# Patient Record
Sex: Male | Born: 1945 | Race: White | Hispanic: No | Marital: Married | Smoking: Current every day smoker
Health system: Southern US, Community
[De-identification: ages and names within clinical notes are randomized; demographics above are authoritative.]

## PROBLEM LIST (undated history)

## (undated) DIAGNOSIS — M1990 Unspecified osteoarthritis, unspecified site: Secondary | ICD-10-CM

## (undated) DIAGNOSIS — R030 Elevated blood-pressure reading, without diagnosis of hypertension: Secondary | ICD-10-CM

## (undated) DIAGNOSIS — A6920 Lyme disease, unspecified: Secondary | ICD-10-CM

## (undated) DIAGNOSIS — Z973 Presence of spectacles and contact lenses: Secondary | ICD-10-CM

## (undated) DIAGNOSIS — H44811 Hemophthalmos, right eye: Secondary | ICD-10-CM

## (undated) DIAGNOSIS — C61 Malignant neoplasm of prostate: Secondary | ICD-10-CM

## (undated) DIAGNOSIS — H3530 Unspecified macular degeneration: Secondary | ICD-10-CM

## (undated) DIAGNOSIS — H348392 Tributary (branch) retinal vein occlusion, unspecified eye, stable: Secondary | ICD-10-CM

## (undated) HISTORY — PX: TONSILLECTOMY: SUR1361

## (undated) HISTORY — DX: Hemophthalmos, right eye: H44.811

## (undated) HISTORY — DX: Malignant neoplasm of prostate: C61

## (undated) HISTORY — PX: PROSTATE BIOPSY: SHX241

---

## 1983-07-30 HISTORY — PX: INGUINAL HERNIA REPAIR: SUR1180

## 1983-07-30 HISTORY — PX: HERNIA REPAIR: SHX51

## 2015-09-15 DIAGNOSIS — R5382 Chronic fatigue, unspecified: Secondary | ICD-10-CM | POA: Diagnosis not present

## 2015-09-15 DIAGNOSIS — R69 Illness, unspecified: Secondary | ICD-10-CM | POA: Diagnosis not present

## 2015-09-15 DIAGNOSIS — I10 Essential (primary) hypertension: Secondary | ICD-10-CM | POA: Diagnosis not present

## 2015-09-15 DIAGNOSIS — Z0000 Encounter for general adult medical examination without abnormal findings: Secondary | ICD-10-CM | POA: Diagnosis not present

## 2015-09-15 DIAGNOSIS — E875 Hyperkalemia: Secondary | ICD-10-CM | POA: Diagnosis not present

## 2015-09-15 DIAGNOSIS — D492 Neoplasm of unspecified behavior of bone, soft tissue, and skin: Secondary | ICD-10-CM | POA: Diagnosis not present

## 2015-09-19 DIAGNOSIS — R030 Elevated blood-pressure reading, without diagnosis of hypertension: Secondary | ICD-10-CM | POA: Diagnosis not present

## 2015-09-19 DIAGNOSIS — Z0000 Encounter for general adult medical examination without abnormal findings: Secondary | ICD-10-CM | POA: Diagnosis not present

## 2015-09-19 DIAGNOSIS — D492 Neoplasm of unspecified behavior of bone, soft tissue, and skin: Secondary | ICD-10-CM | POA: Diagnosis not present

## 2015-09-19 DIAGNOSIS — H35362 Drusen (degenerative) of macula, left eye: Secondary | ICD-10-CM | POA: Diagnosis not present

## 2015-09-19 DIAGNOSIS — Z23 Encounter for immunization: Secondary | ICD-10-CM | POA: Diagnosis not present

## 2015-09-19 DIAGNOSIS — R69 Illness, unspecified: Secondary | ICD-10-CM | POA: Diagnosis not present

## 2015-09-27 DIAGNOSIS — D485 Neoplasm of uncertain behavior of skin: Secondary | ICD-10-CM | POA: Diagnosis not present

## 2015-09-27 DIAGNOSIS — D225 Melanocytic nevi of trunk: Secondary | ICD-10-CM | POA: Diagnosis not present

## 2015-09-27 DIAGNOSIS — D234 Other benign neoplasm of skin of scalp and neck: Secondary | ICD-10-CM | POA: Diagnosis not present

## 2015-11-14 DIAGNOSIS — R030 Elevated blood-pressure reading, without diagnosis of hypertension: Secondary | ICD-10-CM | POA: Diagnosis not present

## 2015-11-14 DIAGNOSIS — D492 Neoplasm of unspecified behavior of bone, soft tissue, and skin: Secondary | ICD-10-CM | POA: Diagnosis not present

## 2015-11-14 DIAGNOSIS — R69 Illness, unspecified: Secondary | ICD-10-CM | POA: Diagnosis not present

## 2015-11-14 DIAGNOSIS — H35362 Drusen (degenerative) of macula, left eye: Secondary | ICD-10-CM | POA: Diagnosis not present

## 2016-01-17 DIAGNOSIS — H353223 Exudative age-related macular degeneration, left eye, with inactive scar: Secondary | ICD-10-CM | POA: Diagnosis not present

## 2016-01-17 DIAGNOSIS — H353112 Nonexudative age-related macular degeneration, right eye, intermediate dry stage: Secondary | ICD-10-CM | POA: Diagnosis not present

## 2016-07-18 DIAGNOSIS — H353112 Nonexudative age-related macular degeneration, right eye, intermediate dry stage: Secondary | ICD-10-CM | POA: Diagnosis not present

## 2016-07-18 DIAGNOSIS — H353223 Exudative age-related macular degeneration, left eye, with inactive scar: Secondary | ICD-10-CM | POA: Diagnosis not present

## 2016-07-29 DIAGNOSIS — Z8619 Personal history of other infectious and parasitic diseases: Secondary | ICD-10-CM

## 2016-07-29 HISTORY — DX: Personal history of other infectious and parasitic diseases: Z86.19

## 2016-09-18 DIAGNOSIS — R69 Illness, unspecified: Secondary | ICD-10-CM | POA: Diagnosis not present

## 2016-09-18 DIAGNOSIS — R030 Elevated blood-pressure reading, without diagnosis of hypertension: Secondary | ICD-10-CM | POA: Diagnosis not present

## 2016-09-18 DIAGNOSIS — H35362 Drusen (degenerative) of macula, left eye: Secondary | ICD-10-CM | POA: Diagnosis not present

## 2016-09-18 DIAGNOSIS — D492 Neoplasm of unspecified behavior of bone, soft tissue, and skin: Secondary | ICD-10-CM | POA: Diagnosis not present

## 2016-09-22 DIAGNOSIS — F17200 Nicotine dependence, unspecified, uncomplicated: Secondary | ICD-10-CM | POA: Insufficient documentation

## 2016-09-22 DIAGNOSIS — H35369 Drusen (degenerative) of macula, unspecified eye: Secondary | ICD-10-CM | POA: Insufficient documentation

## 2016-09-22 DIAGNOSIS — L259 Unspecified contact dermatitis, unspecified cause: Secondary | ICD-10-CM | POA: Insufficient documentation

## 2016-09-22 DIAGNOSIS — D492 Neoplasm of unspecified behavior of bone, soft tissue, and skin: Secondary | ICD-10-CM | POA: Insufficient documentation

## 2016-09-23 DIAGNOSIS — Z1212 Encounter for screening for malignant neoplasm of rectum: Secondary | ICD-10-CM | POA: Diagnosis not present

## 2016-09-23 DIAGNOSIS — L309 Dermatitis, unspecified: Secondary | ICD-10-CM | POA: Diagnosis not present

## 2016-09-23 DIAGNOSIS — Z0000 Encounter for general adult medical examination without abnormal findings: Secondary | ICD-10-CM | POA: Diagnosis not present

## 2016-09-23 DIAGNOSIS — Z6824 Body mass index (BMI) 24.0-24.9, adult: Secondary | ICD-10-CM | POA: Diagnosis not present

## 2016-09-23 DIAGNOSIS — D492 Neoplasm of unspecified behavior of bone, soft tissue, and skin: Secondary | ICD-10-CM | POA: Diagnosis not present

## 2016-09-23 DIAGNOSIS — R69 Illness, unspecified: Secondary | ICD-10-CM | POA: Diagnosis not present

## 2016-09-23 DIAGNOSIS — H35362 Drusen (degenerative) of macula, left eye: Secondary | ICD-10-CM | POA: Diagnosis not present

## 2016-09-23 DIAGNOSIS — R030 Elevated blood-pressure reading, without diagnosis of hypertension: Secondary | ICD-10-CM | POA: Diagnosis not present

## 2016-09-29 DIAGNOSIS — IMO0002 Reserved for concepts with insufficient information to code with codable children: Secondary | ICD-10-CM | POA: Insufficient documentation

## 2016-09-29 DIAGNOSIS — R030 Elevated blood-pressure reading, without diagnosis of hypertension: Secondary | ICD-10-CM | POA: Insufficient documentation

## 2017-01-06 DIAGNOSIS — H353223 Exudative age-related macular degeneration, left eye, with inactive scar: Secondary | ICD-10-CM | POA: Diagnosis not present

## 2017-01-06 DIAGNOSIS — H353112 Nonexudative age-related macular degeneration, right eye, intermediate dry stage: Secondary | ICD-10-CM | POA: Diagnosis not present

## 2017-01-15 DIAGNOSIS — Z6825 Body mass index (BMI) 25.0-25.9, adult: Secondary | ICD-10-CM | POA: Diagnosis not present

## 2017-01-15 DIAGNOSIS — R69 Illness, unspecified: Secondary | ICD-10-CM | POA: Diagnosis not present

## 2017-01-15 DIAGNOSIS — H3530 Unspecified macular degeneration: Secondary | ICD-10-CM | POA: Diagnosis not present

## 2017-01-15 DIAGNOSIS — R030 Elevated blood-pressure reading, without diagnosis of hypertension: Secondary | ICD-10-CM | POA: Diagnosis not present

## 2017-01-15 DIAGNOSIS — Z0000 Encounter for general adult medical examination without abnormal findings: Secondary | ICD-10-CM | POA: Diagnosis not present

## 2017-07-08 DIAGNOSIS — H353223 Exudative age-related macular degeneration, left eye, with inactive scar: Secondary | ICD-10-CM | POA: Diagnosis not present

## 2017-07-08 DIAGNOSIS — H35031 Hypertensive retinopathy, right eye: Secondary | ICD-10-CM | POA: Diagnosis not present

## 2017-07-08 DIAGNOSIS — H353112 Nonexudative age-related macular degeneration, right eye, intermediate dry stage: Secondary | ICD-10-CM | POA: Diagnosis not present

## 2017-08-05 DIAGNOSIS — H35031 Hypertensive retinopathy, right eye: Secondary | ICD-10-CM | POA: Diagnosis not present

## 2017-09-08 DIAGNOSIS — H34831 Tributary (branch) retinal vein occlusion, right eye, with macular edema: Secondary | ICD-10-CM | POA: Diagnosis not present

## 2017-09-08 DIAGNOSIS — H353223 Exudative age-related macular degeneration, left eye, with inactive scar: Secondary | ICD-10-CM | POA: Diagnosis not present

## 2017-09-22 ENCOUNTER — Encounter: Payer: Self-pay | Admitting: Gastroenterology

## 2017-09-22 DIAGNOSIS — E782 Mixed hyperlipidemia: Secondary | ICD-10-CM | POA: Diagnosis not present

## 2017-09-22 DIAGNOSIS — L309 Dermatitis, unspecified: Secondary | ICD-10-CM | POA: Diagnosis not present

## 2017-09-22 DIAGNOSIS — G9332 Myalgic encephalomyelitis/chronic fatigue syndrome: Secondary | ICD-10-CM | POA: Insufficient documentation

## 2017-09-22 DIAGNOSIS — R5382 Chronic fatigue, unspecified: Secondary | ICD-10-CM | POA: Diagnosis not present

## 2017-09-22 DIAGNOSIS — R69 Illness, unspecified: Secondary | ICD-10-CM | POA: Diagnosis not present

## 2017-09-22 DIAGNOSIS — D492 Neoplasm of unspecified behavior of bone, soft tissue, and skin: Secondary | ICD-10-CM | POA: Diagnosis not present

## 2017-09-22 DIAGNOSIS — R030 Elevated blood-pressure reading, without diagnosis of hypertension: Secondary | ICD-10-CM | POA: Diagnosis not present

## 2017-09-22 DIAGNOSIS — H35362 Drusen (degenerative) of macula, left eye: Secondary | ICD-10-CM | POA: Diagnosis not present

## 2017-09-25 DIAGNOSIS — R69 Illness, unspecified: Secondary | ICD-10-CM | POA: Diagnosis not present

## 2017-09-25 DIAGNOSIS — Z0000 Encounter for general adult medical examination without abnormal findings: Secondary | ICD-10-CM | POA: Diagnosis not present

## 2017-09-25 DIAGNOSIS — H35362 Drusen (degenerative) of macula, left eye: Secondary | ICD-10-CM | POA: Diagnosis not present

## 2017-09-25 DIAGNOSIS — Z6824 Body mass index (BMI) 24.0-24.9, adult: Secondary | ICD-10-CM | POA: Diagnosis not present

## 2017-10-13 DIAGNOSIS — H34831 Tributary (branch) retinal vein occlusion, right eye, with macular edema: Secondary | ICD-10-CM | POA: Diagnosis not present

## 2017-10-14 ENCOUNTER — Encounter: Payer: Self-pay | Admitting: Gastroenterology

## 2017-10-14 DIAGNOSIS — Z1211 Encounter for screening for malignant neoplasm of colon: Secondary | ICD-10-CM | POA: Diagnosis not present

## 2017-10-14 DIAGNOSIS — Z1212 Encounter for screening for malignant neoplasm of rectum: Secondary | ICD-10-CM | POA: Diagnosis not present

## 2017-10-14 LAB — COLOGUARD

## 2017-10-21 DIAGNOSIS — R195 Other fecal abnormalities: Secondary | ICD-10-CM | POA: Insufficient documentation

## 2017-10-28 ENCOUNTER — Encounter: Payer: Self-pay | Admitting: Gastroenterology

## 2017-12-15 DIAGNOSIS — H353223 Exudative age-related macular degeneration, left eye, with inactive scar: Secondary | ICD-10-CM | POA: Diagnosis not present

## 2017-12-15 DIAGNOSIS — H34831 Tributary (branch) retinal vein occlusion, right eye, with macular edema: Secondary | ICD-10-CM | POA: Diagnosis not present

## 2018-01-01 ENCOUNTER — Other Ambulatory Visit: Payer: Self-pay | Admitting: *Deleted

## 2018-01-01 ENCOUNTER — Encounter: Payer: Self-pay | Admitting: *Deleted

## 2018-01-01 ENCOUNTER — Encounter: Payer: Self-pay | Admitting: Gastroenterology

## 2018-01-01 ENCOUNTER — Ambulatory Visit (INDEPENDENT_AMBULATORY_CARE_PROVIDER_SITE_OTHER): Payer: Medicare HMO | Admitting: Gastroenterology

## 2018-01-01 DIAGNOSIS — R195 Other fecal abnormalities: Secondary | ICD-10-CM | POA: Diagnosis not present

## 2018-01-01 MED ORDER — NA SULFATE-K SULFATE-MG SULF 17.5-3.13-1.6 GM/177ML PO SOLN: 1.0000 | ORAL | 0 refills | Status: DC

## 2018-01-01 NOTE — Progress Notes (Signed)
Primary Care Physician:  Allwardt, Alyssa, PA  Primary Gastroenterologist:  Barney Drain, MD   Chief Complaint  Patient presents with  . +Cologuard    stool sometimes "runny", doesn't have complete bm    HPI:  Dillon Walton is a 72 y.o. male here at the request of Alyssa Allwardt, PA for further evaluation of positive Cologuard test.  Patient has never had a colonoscopy.  Recently agreed to Cologuard testing, initially declining colonoscopy.  Came back positive.  Generally has a bowel movement every morning but notes that he has to go back a couple times in a row to complete his bowel movement.  Stools are soft.  No blood in the stool or melena.  No abdominal pain.  Appetite is good.  No upper GI symptoms.  No unintentional weight loss.  No family history of colon cancer.  Patient states he has a history of hemorrhoids but really not bothered by them.  Current Outpatient Medications  Medication Sig Dispense Refill  . Multiple Vitamin (MULTIVITAMIN) tablet Take 1 tablet by mouth daily.    . Multiple Vitamins-Minerals (VISION FORMULA EYE HEALTH PO) Take by mouth 2 (two) times daily.    . vitamin C (ASCORBIC ACID) 500 MG tablet Take 1,000 mg by mouth daily.     No current facility-administered medications for this visit.     Allergies as of 01/01/2018  . (No Known Allergies)    Past Medical History:  Diagnosis Date  . Eye hemorrhage, right     History reviewed. No pertinent surgical history.  Family History  Problem Relation Age of Onset  . Other Mother        died age 69  . Other Father        mva  . Colon cancer Neg Hx     Social History   Socioeconomic History  . Marital status: Married    Spouse name: Not on file  . Number of children: Not on file  . Years of education: Not on file  . Highest education level: Not on file  Occupational History  . Not on file  Social Needs  . Financial resource strain: Not on file  . Food insecurity:    Worry: Not on file   Inability: Not on file  . Transportation needs:    Medical: Not on file    Non-medical: Not on file  Tobacco Use  . Smoking status: Current Every Day Smoker    Types: Cigarettes  . Smokeless tobacco: Never Used  Substance and Sexual Activity  . Alcohol use: Yes    Comment: 12-15 cans of beer/week  . Drug use: Not on file  . Sexual activity: Never  Lifestyle  . Physical activity:    Days per week: Not on file    Minutes per session: Not on file  . Stress: Not on file  Relationships  . Social connections:    Talks on phone: Not on file    Gets together: Not on file    Attends religious service: Not on file    Active member of club or organization: Not on file    Attends meetings of clubs or organizations: Not on file    Relationship status: Not on file  . Intimate partner violence:    Fear of current or ex partner: Not on file    Emotionally abused: Not on file    Physically abused: Not on file    Forced sexual activity: Not on file  Other Topics Concern  .  Not on file  Social History Narrative  . Not on file      ROS:  General: Negative for anorexia, weight loss, fever, chills, fatigue, weakness. Eyes: Negative for vision changes.  ENT: Negative for hoarseness, difficulty swallowing , nasal congestion. CV: Negative for chest pain, angina, palpitations, dyspnea on exertion, peripheral edema.  Respiratory: Negative for dyspnea at rest, dyspnea on exertion, cough, sputum, wheezing.  GI: See history of present illness. GU:  Negative for dysuria, hematuria, urinary incontinence, urinary frequency, nocturnal urination.  MS: Negative for joint pain, low back pain.  Derm: Negative for rash or itching.  Neuro: Negative for weakness, abnormal sensation, seizure, frequent headaches, memory loss, confusion.  Psych: Negative for anxiety, depression, suicidal ideation, hallucinations.  Endo: Negative for unusual weight change.  Heme: Negative for bruising or bleeding. Allergy:  Negative for rash or hives.    Physical Examination:  BP (!) 147/96   Pulse 97   Temp 97.9 F (36.6 C) (Oral)   Ht 5\' 10"  (1.778 m)   Wt 178 lb (80.7 kg)   BMI 25.54 kg/m    General: Well-nourished, well-developed in no acute distress.  Head: Normocephalic, atraumatic.   Eyes: Conjunctiva pink, no icterus. Mouth: Oropharyngeal mucosa moist and pink , no lesions erythema or exudate. Neck: Supple without thyromegaly, masses, or lymphadenopathy.  Lungs: Clear to auscultation bilaterally.  Heart: Regular rate and rhythm, no murmurs rubs or gallops.  Abdomen: Bowel sounds are normal, nontender, nondistended, no hepatosplenomegaly or masses, no abdominal bruits or    hernia , no rebound or guarding.   Rectal: Deferred Extremities: No lower extremity edema. No clubbing or deformities.  Neuro: Alert and oriented x 4 , grossly normal neurologically.  Skin: Warm and dry, no rash or jaundice.   Psych: Alert and cooperative, normal mood and affect.  Labs: Labs dated 09/22/2017 TSH 1.270, white blood cell count 8900, hemoglobin 15.8, hematocrit 46.4, MCV 93, platelets 3 and 34,000, glucose 86, BUN 14, creatinine 0.99, sodium 145, potassium 4.9, calcium 9.5, total bilirubin 0.6, alkaline phosphatase 93, AST 20, ALT 16, albumin 4.5.  Imaging Studies: No results found.

## 2018-01-01 NOTE — Assessment & Plan Note (Addendum)
72 year old gentleman with no prior colonoscopy presenting for positive Cologuard test. Minor change in bowel function.  Recommend colonoscopy with deep sedation (given frequent alcohol use).  I have discussed the risks, alternatives, benefits with regards to but not limited to the risk of reaction to medication, bleeding, infection, perforation and the patient is agreeable to proceed. Written consent to be obtained.

## 2018-01-01 NOTE — Patient Instructions (Signed)
1. Colonoscopy as scheduled. See separate instructions.  

## 2018-01-02 ENCOUNTER — Telehealth: Payer: Self-pay | Admitting: *Deleted

## 2018-01-02 ENCOUNTER — Telehealth: Payer: Self-pay | Admitting: Gastroenterology

## 2018-01-02 MED ORDER — PEG 3350-KCL-NA BICARB-NACL 420 G PO SOLR: 4000.0000 mL | Freq: Once | ORAL | 0 refills | Status: AC

## 2018-01-02 NOTE — Telephone Encounter (Signed)
Spoke with pt spouse. She is aware will send in trylite for pt. Will mail new instructions to them (confirmed mailing address).

## 2018-01-02 NOTE — Progress Notes (Signed)
cc'd to pcp 

## 2018-01-02 NOTE — Telephone Encounter (Signed)
PATIENT WIFE CALLED AND STATED THAT THE SUPREP IS 100$ AND HE WILL NOT TAKE IT.  HE WILL NEED SOMETHING ELSE CALLED IN.  PLEASE CALL HIS WIFE ABOUT THIS.

## 2018-01-02 NOTE — Telephone Encounter (Signed)
Pre-op scheduled for 03/17/18 at 11:00am. Letter mailed. LMOVM.

## 2018-01-26 DIAGNOSIS — A6920 Lyme disease, unspecified: Secondary | ICD-10-CM

## 2018-01-26 HISTORY — DX: Lyme disease, unspecified: A69.20

## 2018-02-14 DIAGNOSIS — R6883 Chills (without fever): Secondary | ICD-10-CM | POA: Diagnosis not present

## 2018-02-14 DIAGNOSIS — R339 Retention of urine, unspecified: Secondary | ICD-10-CM | POA: Diagnosis not present

## 2018-02-14 DIAGNOSIS — Z6824 Body mass index (BMI) 24.0-24.9, adult: Secondary | ICD-10-CM | POA: Diagnosis not present

## 2018-02-16 DIAGNOSIS — E860 Dehydration: Secondary | ICD-10-CM | POA: Diagnosis not present

## 2018-02-16 DIAGNOSIS — E876 Hypokalemia: Secondary | ICD-10-CM | POA: Diagnosis not present

## 2018-02-17 DIAGNOSIS — R5383 Other fatigue: Secondary | ICD-10-CM | POA: Diagnosis not present

## 2018-02-17 DIAGNOSIS — D696 Thrombocytopenia, unspecified: Secondary | ICD-10-CM | POA: Diagnosis not present

## 2018-02-17 DIAGNOSIS — Z6824 Body mass index (BMI) 24.0-24.9, adult: Secondary | ICD-10-CM | POA: Diagnosis not present

## 2018-02-17 DIAGNOSIS — R6883 Chills (without fever): Secondary | ICD-10-CM | POA: Diagnosis not present

## 2018-03-13 NOTE — Patient Instructions (Signed)
[facility]  03/13/2018     @PREFPERIOPPHARMACY @   Your procedure is scheduled on  03/24/2018   Report to [facility] at  615   A.M.  Call this number if you have problems the morning of surgery:  [phone]   Remember:  Do not eat or drink after midnight.  You may drink clear liquids until ( follow the instructions given to you) .  Clear liquids allowed are:                    Water, Juice (non-citric and without pulp), Carbonated beverages, Clear Tea, Black Coffee only, Plain Jell-O only, Gatorade and Plain Popsicles only    Take these medicines the morning of surgery with A SIP OF WATER None    Do not wear jewelry, make-up or nail polish.  Do not wear lotions, powders, or perfumes, or deodorant.  Do not shave 48 hours prior to surgery.  Men may shave face and neck.  Do not bring valuables to the hospital.  [facility] is not responsible for any belongings or valuables.  Contacts, dentures or bridgework may not be worn into surgery.  Leave your suitcase in the car.  After surgery it may be brought to your room.  For patients admitted to the hospital, discharge time will be determined by your treatment team.  Patients discharged the day of surgery will not be allowed to drive home.   Name and phone number of your driver:   family Special instructions:  Follow the diet and prep instructions given to you by Dr Nona Dell office.  Please read over the following fact sheets that you were given. Anesthesia Post-op Instructions and Care and Recovery After Surgery       Colonoscopy, Adult A colonoscopy is an exam to look at the large intestine. It is done to check for problems, such as:  Lumps (tumors).  Growths (polyps).  Swelling (inflammation).  Bleeding.  What happens before the procedure? Eating and drinking Follow instructions from your doctor about eating and drinking. These instructions may include:  A few days before the procedure - follow  a low-fiber diet. ? Avoid nuts. ? Avoid seeds. ? Avoid dried fruit. ? Avoid raw fruits. ? Avoid vegetables.  1-3 days before the procedure - follow a clear liquid diet. Avoid liquids that have red or purple dye. Drink only clear liquids, such as: ? Clear broth or bouillon. ? Black coffee or tea. ? Clear juice. ? Clear soft drinks or sports drinks. ? Gelatin dessert. ? Popsicles.  On the day of the procedure - do not eat or drink anything during the 2 hours before the procedure.  Bowel prep If you were prescribed an oral bowel prep:  Take it as told by your doctor. Starting the day before your procedure, you will need to drink a lot of liquid. The liquid will cause you to poop (have bowel movements) until your poop is almost clear or light green.  If your skin or butt gets irritated from diarrhea, you may: ? Wipe the area with wipes that have medicine in them, such as adult wet wipes with aloe and vitamin E. ? Put something on your skin that soothes the area, such as petroleum jelly.  If you throw up (vomit) while drinking the bowel prep, take a break for up to 60 minutes. Then begin the bowel prep again. If you keep throwing up and you cannot  take the bowel prep without throwing up, call your doctor.  General instructions  Ask your doctor about changing or stopping your normal medicines. This is important if you take diabetes medicines or blood thinners.  Plan to have someone take you home from the hospital or clinic. What happens during the procedure?  An IV tube may be put into one of your veins.  You will be given medicine to help you relax (sedative).  To reduce your risk of infection: ? Your doctors will wash their hands. ? Your anal area will be washed with soap.  You will be asked to lie on your side with your knees bent.  Your doctor will get a long, thin, flexible tube ready. The tube will have a camera and a light on the end.  The tube will be put into your  anus.  The tube will be gently put into your large intestine.  Air will be delivered into your large intestine to keep it open. You may feel some pressure or cramping.  The camera will be used to take photos.  A small tissue sample may be removed from your body to be looked at under a microscope (biopsy). If any possible problems are found, the tissue will be sent to a lab for testing.  If small growths are found, your doctor may remove them and have them checked for cancer.  The tube that was put into your anus will be slowly removed. The procedure may vary among doctors and hospitals. What happens after the procedure?  Your doctor will check on you often until the medicines you were given have worn off.  Do not drive for 24 hours after the procedure.  You may have a small amount of blood in your poop.  You may pass gas.  You may have mild cramps or bloating in your belly (abdomen).  It is up to you to get the results of your procedure. Ask your doctor, or the department performing the procedure, when your results will be ready. This information is not intended to replace advice given to you by your health care provider. Make sure you discuss any questions you have with your health care provider. Document Released: 08/17/2010 Document Revised: 05/15/2016 Document Reviewed: 09/26/2015 Elsevier Interactive Patient Education  2017 [employer]  Colonoscopy, Adult, Care After This sheet gives you information about how to care for yourself after your procedure. Your health care provider may also give you more specific instructions. If you have problems or questions, contact your health care provider. What can I expect after the procedure? After the procedure, it is common to have:  A small amount of blood in your stool for 24 hours after the procedure.  Some gas.  Mild abdominal cramping or bloating.  Follow these instructions at home: General instructions   For the first  24 hours after the procedure: ? Do not drive or use machinery. ? Do not sign important documents. ? Do not drink alcohol. ? Do your regular daily activities at a slower pace than normal. ? Eat soft, easy-to-digest foods. ? Rest often.  Take over-the-counter or prescription medicines only as told by your health care provider.  It is up to you to get the results of your procedure. Ask your health care provider, or the department performing the procedure, when your results will be ready. Relieving cramping and bloating  Try walking around when you have cramps or feel bloated.  Apply heat to your abdomen as told by your  health care provider. Use a heat source that your health care provider recommends, such as a moist heat pack or a heating pad. ? Place a towel between your skin and the heat source. ? Leave the heat on for 20-30 minutes. ? Remove the heat if your skin turns bright red. This is especially important if you are unable to feel pain, heat, or cold. You may have a greater risk of getting burned. Eating and drinking  Drink enough fluid to keep your urine clear or pale yellow.  Resume your normal diet as instructed by your health care provider. Avoid heavy or fried foods that are hard to digest.  Avoid drinking alcohol for as long as instructed by your health care provider. Contact a health care provider if:  You have blood in your stool 2-3 days after the procedure. Get help right away if:  You have more than a small spotting of blood in your stool.  You pass large blood clots in your stool.  Your abdomen is swollen.  You have nausea or vomiting.  You have a fever.  You have increasing abdominal pain that is not relieved with medicine. This information is not intended to replace advice given to you by your health care provider. Make sure you discuss any questions you have with your health care provider. Document Released: 02/27/2004 Document Revised: 04/08/2016  Document Reviewed: 09/26/2015 Elsevier Interactive Patient Education  2018 [place] Anesthesia is a term that refers to techniques, procedures, and medicines that help a person stay safe and comfortable during a medical procedure. Monitored anesthesia care, or sedation, is one type of anesthesia. Your anesthesia specialist may recommend sedation if you will be having a procedure that does not require you to be unconscious, such as:  Cataract surgery.  A dental procedure.  A biopsy.  A colonoscopy.  During the procedure, you may receive a medicine to help you relax (sedative). There are three levels of sedation:  Mild sedation. At this level, you may feel awake and relaxed. You will be able to follow directions.  Moderate sedation. At this level, you will be sleepy. You may not remember the procedure.  Deep sedation. At this level, you will be asleep. You will not remember the procedure.  The more medicine you are given, the deeper your level of sedation will be. Depending on how you respond to the procedure, the anesthesia specialist may change your level of sedation or the type of anesthesia to fit your needs. An anesthesia specialist will monitor you closely during the procedure. Let your health care provider know about:  Any allergies you have.  All medicines you are taking, including vitamins, herbs, eye drops, creams, and over-the-counter medicines.  Any use of steroids (by mouth or as a cream).  Any problems you or family members have had with sedatives and anesthetic medicines.  Any blood disorders you have.  Any surgeries you have had.  Any medical conditions you have, such as sleep apnea.  Whether you are pregnant or may be pregnant.  Any use of cigarettes, alcohol, or street drugs. What are the risks? Generally, this is a safe procedure. However, problems may occur, including:  Getting too much medicine  (oversedation).  Nausea.  Allergic reaction to medicines.  Trouble breathing. If this happens, a breathing tube may be used to help with breathing. It will be removed when you are awake and breathing on your own.  Heart trouble.  Lung trouble.  Before the  procedure Staying hydrated Follow instructions from your health care provider about hydration, which may include:  Up to 2 hours before the procedure - you may continue to drink clear liquids, such as water, clear fruit juice, black coffee, and plain tea.  Eating and drinking restrictions Follow instructions from your health care provider about eating and drinking, which may include:  8 hours before the procedure - stop eating heavy meals or foods such as meat, fried foods, or fatty foods.  6 hours before the procedure - stop eating light meals or foods, such as toast or cereal.  6 hours before the procedure - stop drinking milk or drinks that contain milk.  2 hours before the procedure - stop drinking clear liquids.  Medicines Ask your health care provider about:  Changing or stopping your regular medicines. This is especially important if you are taking diabetes medicines or blood thinners.  Taking medicines such as aspirin and ibuprofen. These medicines can thin your blood. Do not take these medicines before your procedure if your health care provider instructs you not to.  Tests and exams  You will have a physical exam.  You may have blood tests done to show: ? How well your kidneys and liver are working. ? How well your blood can clot.  General instructions  Plan to have someone take you home from the hospital or clinic.  If you will be going home right after the procedure, plan to have someone with you for 24 hours.  What happens during the procedure?  Your blood pressure, heart rate, breathing, level of pain and overall condition will be monitored.  An IV tube will be inserted into one of your  veins.  Your anesthesia specialist will give you medicines as needed to keep you comfortable during the procedure. This may mean changing the level of sedation.  The procedure will be performed. After the procedure  Your blood pressure, heart rate, breathing rate, and blood oxygen level will be monitored until the medicines you were given have worn off.  Do not drive for 24 hours if you received a sedative.  You may: ? Feel sleepy, clumsy, or nauseous. ? Feel forgetful about what happened after the procedure. ? Have a sore throat if you had a breathing tube during the procedure. ? Vomit. This information is not intended to replace advice given to you by your health care provider. Make sure you discuss any questions you have with your health care provider. Document Released: 04/10/2005 Document Revised: 12/22/2015 Document Reviewed: 11/05/2015 Elsevier Interactive Patient Education  2018 [place], Care After These instructions provide you with information about caring for yourself after your procedure. Your health care provider may also give you more specific instructions. Your treatment has been planned according to current medical practices, but problems sometimes occur. Call your health care provider if you have any problems or questions after your procedure. What can I expect after the procedure? After your procedure, it is common to:  Feel sleepy for several hours.  Feel clumsy and have poor balance for several hours.  Feel forgetful about what happened after the procedure.  Have poor judgment for several hours.  Feel nauseous or vomit.  Have a sore throat if you had a breathing tube during the procedure.  Follow these instructions at home: For at least 24 hours after the procedure:   Do not: ? Participate in activities in which you could fall or become injured. ? Drive. ? Use heavy  machinery. ? Drink alcohol. ? Take sleeping pills or  medicines that cause drowsiness. ? Make important decisions or sign legal documents. ? Take care of children on your own.  Rest. Eating and drinking  Follow the diet that is recommended by your health care provider.  If you vomit, drink water, juice, or soup when you can drink without vomiting.  Make sure you have little or no nausea before eating solid foods. General instructions  Have a responsible adult stay with you until you are awake and alert.  Take over-the-counter and prescription medicines only as told by your health care provider.  If you smoke, do not smoke without supervision.  Keep all follow-up visits as told by your health care provider. This is important. Contact a health care provider if:  You keep feeling nauseous or you keep vomiting.  You feel light-headed.  You develop a rash.  You have a fever. Get help right away if:  You have trouble breathing. This information is not intended to replace advice given to you by your health care provider. Make sure you discuss any questions you have with your health care provider. Document Released: 11/05/2015 Document Revised: 03/06/2016 Document Reviewed: 11/05/2015 Elsevier Interactive Patient Education  [employer].

## 2018-03-17 ENCOUNTER — Other Ambulatory Visit: Payer: Self-pay

## 2018-03-17 ENCOUNTER — Encounter (HOSPITAL_COMMUNITY)
Admission: RE | Admit: 2018-03-17 | Discharge: 2018-03-17 | Disposition: A | Discharge status: Home / Self Care | Payer: Medicare HMO | Source: Ambulatory Visit | Attending: Gastroenterology | Admitting: Gastroenterology

## 2018-03-17 ENCOUNTER — Encounter (HOSPITAL_COMMUNITY): Payer: Self-pay

## 2018-03-17 DIAGNOSIS — Z01810 Encounter for preprocedural cardiovascular examination: Secondary | ICD-10-CM | POA: Diagnosis not present

## 2018-03-17 DIAGNOSIS — Z01812 Encounter for preprocedural laboratory examination: Secondary | ICD-10-CM | POA: Insufficient documentation

## 2018-03-17 HISTORY — DX: Lyme disease, unspecified: A69.20

## 2018-03-17 LAB — CBC
HCT: 44 % (ref 39.0–52.0)
Hemoglobin: 15 g/dL (ref 13.0–17.0)
MCH: 32.3 pg (ref 26.0–34.0)
MCHC: 34.1 g/dL (ref 30.0–36.0)
MCV: 94.6 fL (ref 78.0–100.0)
Platelets: 288 10*3/uL (ref 150–400)
RBC: 4.65 MIL/uL (ref 4.22–5.81)
RDW: 14.1 % (ref 11.5–15.5)
WBC: 10.5 10*3/uL (ref 4.0–10.5)

## 2018-03-17 LAB — BASIC METABOLIC PANEL
Anion gap: 10 (ref 5–15)
BUN: 10 mg/dL (ref 8–23)
CO2: 25 mmol/L (ref 22–32)
Calcium: 9 mg/dL (ref 8.9–10.3)
Chloride: 104 mmol/L (ref 98–111)
Creatinine, Ser: 0.89 mg/dL (ref 0.61–1.24)
GFR calc Af Amer: >60 mL/min (ref >60)
GFR calc non Af Amer: >60 mL/min (ref >60)
Glucose, Bld: 73 mg/dL (ref 70–99)
Potassium: 3.8 mmol/L (ref 3.5–5.1)
Sodium: 139 mmol/L (ref 135–145)

## 2018-03-18 DIAGNOSIS — R5382 Chronic fatigue, unspecified: Secondary | ICD-10-CM | POA: Diagnosis not present

## 2018-03-18 DIAGNOSIS — D696 Thrombocytopenia, unspecified: Secondary | ICD-10-CM | POA: Diagnosis not present

## 2018-03-24 ENCOUNTER — Other Ambulatory Visit: Payer: Self-pay

## 2018-03-24 ENCOUNTER — Ambulatory Visit (HOSPITAL_COMMUNITY): Payer: Medicare HMO | Admitting: Anesthesiology

## 2018-03-24 ENCOUNTER — Encounter (HOSPITAL_COMMUNITY): Payer: Self-pay | Admitting: *Deleted

## 2018-03-24 ENCOUNTER — Ambulatory Visit (HOSPITAL_COMMUNITY)
Admission: RE | Admit: 2018-03-24 | Discharge: 2018-03-24 | Disposition: A | Discharge status: Home / Self Care | Payer: Medicare HMO | Source: Ambulatory Visit | Attending: Gastroenterology | Admitting: Gastroenterology

## 2018-03-24 ENCOUNTER — Encounter (HOSPITAL_COMMUNITY)
Admission: RE | Disposition: A | Discharge status: Home / Self Care | Payer: Self-pay | Source: Ambulatory Visit | Attending: Gastroenterology

## 2018-03-24 DIAGNOSIS — D124 Benign neoplasm of descending colon: Secondary | ICD-10-CM

## 2018-03-24 DIAGNOSIS — F17210 Nicotine dependence, cigarettes, uncomplicated: Secondary | ICD-10-CM | POA: Insufficient documentation

## 2018-03-24 DIAGNOSIS — D122 Benign neoplasm of ascending colon: Secondary | ICD-10-CM | POA: Diagnosis not present

## 2018-03-24 DIAGNOSIS — K648 Other hemorrhoids: Secondary | ICD-10-CM | POA: Insufficient documentation

## 2018-03-24 DIAGNOSIS — D123 Benign neoplasm of transverse colon: Secondary | ICD-10-CM | POA: Diagnosis not present

## 2018-03-24 DIAGNOSIS — K5730 Diverticulosis of large intestine without perforation or abscess without bleeding: Secondary | ICD-10-CM | POA: Diagnosis not present

## 2018-03-24 DIAGNOSIS — D127 Benign neoplasm of rectosigmoid junction: Secondary | ICD-10-CM

## 2018-03-24 DIAGNOSIS — D128 Benign neoplasm of rectum: Secondary | ICD-10-CM | POA: Diagnosis not present

## 2018-03-24 DIAGNOSIS — K621 Rectal polyp: Secondary | ICD-10-CM

## 2018-03-24 DIAGNOSIS — R69 Illness, unspecified: Secondary | ICD-10-CM | POA: Diagnosis not present

## 2018-03-24 DIAGNOSIS — K635 Polyp of colon: Secondary | ICD-10-CM

## 2018-03-24 DIAGNOSIS — Z79899 Other long term (current) drug therapy: Secondary | ICD-10-CM | POA: Insufficient documentation

## 2018-03-24 DIAGNOSIS — R195 Other fecal abnormalities: Secondary | ICD-10-CM

## 2018-03-24 DIAGNOSIS — K644 Residual hemorrhoidal skin tags: Secondary | ICD-10-CM | POA: Insufficient documentation

## 2018-03-24 HISTORY — PX: POLYPECTOMY: SHX5525

## 2018-03-24 HISTORY — PX: COLONOSCOPY WITH PROPOFOL: SHX5780

## 2018-03-24 HISTORY — DX: Unspecified osteoarthritis, unspecified site: M19.90

## 2018-03-24 SURGERY — COLONOSCOPY WITH PROPOFOL
Anesthesia: General

## 2018-03-24 MED ORDER — LACTATED RINGERS IV SOLN
INTRAVENOUS | Status: DC
  Administered 2018-03-24: 07:00:00 via INTRAVENOUS

## 2018-03-24 MED ORDER — FENTANYL CITRATE (PF) 100 MCG/2ML IJ SOLN: 25.0000 ug | INTRAMUSCULAR | Status: DC | PRN

## 2018-03-24 MED ORDER — PROPOFOL 10 MG/ML IV BOLUS
INTRAVENOUS | Status: DC | PRN
  Administered 2018-03-24 (×6): 20 mg via INTRAVENOUS
  Administered 2018-03-24: 10 mg via INTRAVENOUS
  Administered 2018-03-24: 20 mg via INTRAVENOUS

## 2018-03-24 MED ORDER — PROPOFOL 500 MG/50ML IV EMUL
INTRAVENOUS | Status: DC | PRN
  Administered 2018-03-24: 08:00:00 via INTRAVENOUS
  Administered 2018-03-24: 150 ug/kg/min via INTRAVENOUS
  Administered 2018-03-24: 08:00:00 via INTRAVENOUS

## 2018-03-24 MED ORDER — SPOT INK MARKER SYRINGE KIT
PACK | SUBMUCOSAL | Status: AC
  Filled 2018-03-24: qty 5

## 2018-03-24 MED ORDER — PROPOFOL 10 MG/ML IV BOLUS
INTRAVENOUS | Status: AC
  Filled 2018-03-24: qty 20

## 2018-03-24 MED ORDER — PROPOFOL 10 MG/ML IV BOLUS
INTRAVENOUS | Status: AC
  Filled 2018-03-24: qty 40

## 2018-03-24 MED ORDER — SODIUM CHLORIDE 0.9 % IJ SOLN
INTRAMUSCULAR | Status: DC | PRN
  Administered 2018-03-24: 3 mL

## 2018-03-24 MED ORDER — SPOT INK MARKER SYRINGE KIT
PACK | SUBMUCOSAL | Status: DC | PRN
  Administered 2018-03-24: 1 mL via SUBMUCOSAL

## 2018-03-24 MED ORDER — HYDROCODONE-ACETAMINOPHEN 7.5-325 MG PO TABS: 1.0000 | ORAL_TABLET | Freq: Once | ORAL | Status: DC | PRN

## 2018-03-24 NOTE — Discharge Instructions (Signed)
You have small internal hemorrhoids and diverticulosis IN YOUR LEFT AND RIGHT COLON. YOU HAD EIGHT POLYPS REMOVED. ONE WAS LARGER AND I TATTOOED THE BASE.   IF YOU NEED AN MRI, LET THE RADIOLOGY TECH KNOW THAT YOU HAD TWO CLIPS PLACED IN YOUR RECTUM. THEY SHOULD FALL OFF IN 30 DAYS.   DRINK WATER TO KEEP YOUR URINE LIGHT YELLOW.  FOLLOW A HIGH FIBER DIET. AVOID ITEMS THAT CAUSE BLOATING. See info below.   YOUR BIOPSY RESULTS WILL BE AVAILABLE IN 7 DAYS   USE PREPARATION H FOUR TIMES  A DAY IF NEEDED TO RELIEVE RECTAL PAIN/PRESSURE/BLEEDING.  Next colonoscopy in 1-3 years. YOUR SISTERS, BROTHERS, CHILDREN, AND PARENTS NEED TO HAVE A COLONOSCOPY STARTING AT THE AGE OF 40.    Colonoscopy Care After Read the instructions outlined below and refer to this sheet in the next week. These discharge instructions provide you with general information on caring for yourself after you leave the hospital. While your treatment has been planned according to the most current medical practices available, unavoidable complications occasionally occur. If you have any problems or questions after discharge, call DR. Quinesha Selinger, [phone].  ACTIVITY  You may resume your regular activity, but move at a slower pace for the next 24 hours.   Take frequent rest periods for the next 24 hours.   Walking will help get rid of the air and reduce the bloated feeling in your belly (abdomen).   No driving for 24 hours (because of the medicine (anesthesia) used during the test).   You may shower.   Do not sign any important legal documents or operate any machinery for 24 hours (because of the anesthesia used during the test).    NUTRITION  Drink plenty of fluids.   You may resume your normal diet as instructed by your doctor.   Begin with a light meal and progress to your normal diet. Heavy or fried foods are harder to digest and may make you feel sick to your stomach (nauseated).   Avoid alcoholic beverages  for 24 hours or as instructed.    MEDICATIONS  You may resume your normal medications.   WHAT YOU CAN EXPECT TODAY  Some feelings of bloating in the abdomen.   Passage of more gas than usual.   Spotting of blood in your stool or on the toilet paper  .  IF YOU HAD POLYPS REMOVED DURING THE COLONOSCOPY:  Eat a soft diet IF YOU HAVE NAUSEA, BLOATING, ABDOMINAL PAIN, OR VOMITING.    FINDING OUT THE RESULTS OF YOUR TEST Not all test results are available during your visit. DR. Oneida Alar WILL CALL YOU WITHIN 14 DAYS OF YOUR PROCEDUE WITH YOUR RESULTS. Do not assume everything is normal if you have not heard from DR. Cletus Mehlhoff, CALL HER OFFICE AT [phone].  SEEK IMMEDIATE MEDICAL ATTENTION AND CALL THE OFFICE: [phone] IF:  You have more than a spotting of blood in your stool.   Your belly is swollen (abdominal distention).   You are nauseated or vomiting.   You have a temperature over 101F.   You have abdominal pain or discomfort that is severe or gets worse throughout the day.  High-Fiber Diet A high-fiber diet changes your normal diet to include more whole grains, legumes, fruits, and vegetables. Changes in the diet involve replacing refined carbohydrates with unrefined foods. The calorie level of the diet is essentially unchanged. The Dietary Reference Intake (recommended amount) for adult males is 38 grams per day. For adult females, it  is 25 grams per day. Pregnant and lactating women should consume 28 grams of fiber per day. Fiber is the intact part of a plant that is not broken down during digestion. Functional fiber is fiber that has been isolated from the plant to provide a beneficial effect in the body. PURPOSE  Increase stool bulk.   Ease and regulate bowel movements.   Lower cholesterol.   REDUCE RISK OF COLON CANCER  INDICATIONS THAT YOU NEED MORE FIBER  Constipation and hemorrhoids.   Uncomplicated diverticulosis (intestine condition) and irritable  bowel syndrome.   Weight management.   As a protective measure against hardening of the arteries (atherosclerosis), diabetes, and cancer.   GUIDELINES FOR INCREASING FIBER IN THE DIET  Start adding fiber to the diet slowly. A gradual increase of about 5 more grams (2 slices of whole-wheat bread, 2 servings of most fruits or vegetables, or 1 bowl of high-fiber cereal) per day is best. Too rapid an increase in fiber may result in constipation, flatulence, and bloating.   Drink enough water and fluids to keep your urine clear or pale yellow. Water, juice, or caffeine-free drinks are recommended. Not drinking enough fluid may cause constipation.   Eat a variety of high-fiber foods rather than one type of fiber.   Try to increase your intake of fiber through using high-fiber foods rather than fiber pills or supplements that contain small amounts of fiber.   The goal is to change the types of food eaten. Do not supplement your present diet with high-fiber foods, but replace foods in your present diet.   INCLUDE A VARIETY OF FIBER SOURCES  Replace refined and processed grains with whole grains, canned fruits with fresh fruits, and incorporate other fiber sources. White rice, white breads, and most bakery goods contain little or no fiber.   Brown whole-grain rice, buckwheat oats, and many fruits and vegetables are all good sources of fiber. These include: broccoli, Brussels sprouts, cabbage, cauliflower, beets, sweet potatoes, white potatoes (skin on), carrots, tomatoes, eggplant, squash, berries, fresh fruits, and dried fruits.   Cereals appear to be the richest source of fiber. Cereal fiber is found in whole grains and bran. Bran is the fiber-rich outer coat of cereal grain, which is largely removed in refining. In whole-grain cereals, the bran remains. In breakfast cereals, the largest amount of fiber is found in those with "bran" in their names. The fiber content is sometimes indicated on the  label.   You may need to include additional fruits and vegetables each day.   In baking, for 1 cup white flour, you may use the following substitutions:   1 cup whole-wheat flour minus 2 tablespoons.   1/2 cup white flour plus 1/2 cup whole-wheat flour.   Polyps, Colon  A polyp is extra tissue that grows inside your body. Colon polyps grow in the large intestine. The large intestine, also called the colon, is part of your digestive system. It is a long, hollow tube at the end of your digestive tract where your body makes and stores stool. Most polyps are not dangerous. They are benign. This means they are not cancerous. But over time, some types of polyps can turn into cancer. Polyps that are smaller than a pea are usually not harmful. But larger polyps could someday become or may already be cancerous. To be safe, doctors remove all polyps and test them.   PREVENTION There is not one sure way to prevent polyps. You might be able to lower your  risk of getting them if you:  Eat more fruits and vegetables and less fatty food.   Do not smoke.   Avoid alcohol.   Exercise every day.   Lose weight if you are overweight.   Eating more calcium and folate can also lower your risk of getting polyps. Some foods that are rich in calcium are milk, cheese, and broccoli. Some foods that are rich in folate are chickpeas, kidney beans, and spinach.    Diverticulosis Diverticulosis is a common condition that develops when small pouches (diverticula) form in the wall of the colon. The risk of diverticulosis increases with age. It happens more often in people who eat a low-fiber diet. Most individuals with diverticulosis have no symptoms. Those individuals with symptoms usually experience belly (abdominal) pain, constipation, or loose stools (diarrhea).  HOME CARE INSTRUCTIONS  Increase the amount of fiber in your diet as directed by your caregiver or dietician. This may reduce symptoms of  diverticulosis.   Drink at least 6 to 8 glasses of water each day to prevent constipation.   Try not to strain when you have a bowel movement.   Avoiding nuts and seeds to prevent complications is NOT NECESSARY.   FOODS HAVING HIGH FIBER CONTENT INCLUDE:  Fruits. Apple, peach, pear, tangerine, raisins, prunes.   Vegetables. Brussels sprouts, asparagus, broccoli, cabbage, carrot, cauliflower, romaine lettuce, spinach, summer squash, tomato, winter squash, zucchini.   Starchy Vegetables. Baked beans, kidney beans, lima beans, split peas, lentils, potatoes (with skin).   Grains. Whole wheat bread, brown rice, bran flake cereal, plain oatmeal, white rice, shredded wheat, bran muffins.   SEEK IMMEDIATE MEDICAL CARE IF:  You develop increasing pain or severe bloating.   You have an oral temperature above 101F.   You develop vomiting or bowel movements that are bloody or black.   Hemorrhoids Hemorrhoids are dilated (enlarged) veins around the rectum. Sometimes clots will form in the veins. This makes them swollen and painful. These are called thrombosed hemorrhoids. Causes of hemorrhoids include:  Constipation.   Straining to have a bowel movement.   HEAVY LIFTING   HOME CARE INSTRUCTIONS  Eat a well balanced diet and drink 6 to 8 glasses of water every day to avoid constipation. You may also use a bulk laxative.   Avoid straining to have bowel movements.   Keep anal area dry and clean.   Do not use a donut shaped pillow or sit on the toilet for long periods. This increases blood pooling and pain.   Move your bowels when your body has the urge; this will require less straining and will decrease pain and pressure.

## 2018-03-24 NOTE — H&P (Signed)
Primary Care Physician:  Allwardt, Alyssa, PA Primary Gastroenterologist:  Dr. Oneida Alar  Pre-Procedure History & Physical: HPI:  Dillon Duris Sr. is a 72 y.o. male here for postive cologard test.  Past Medical History:  Diagnosis Date  . Arthritis   . Eye hemorrhage, right   . Lyme disease 01/2018    Past Surgical History:  Procedure Laterality Date  . HERNIA REPAIR  1985  . TONSILLECTOMY      Prior to Admission medications   Medication Sig Start Date End Date Taking? Authorizing Provider  diphenhydramine-acetaminophen (TYLENOL PM) 25-500 MG TABS tablet Take 2 tablets by mouth at bedtime.   Yes [provider]  Multiple Vitamin (MULTIVITAMIN) tablet Take 1 tablet by mouth daily.   Yes [provider]  Multiple Vitamins-Minerals (VISION FORMULA EYE HEALTH PO) Take 1 tablet by mouth 2 (two) times daily.    Yes [provider]  Na Sulfate-K Sulfate-Mg Sulf 17.5-3.13-1.6 GM/177ML SOLN Take 1 kit by mouth as directed. 01/01/18  Yes Yakir Wenke L, MD  vitamin C (ASCORBIC ACID) 500 MG tablet Take 500 mg by mouth daily.    Yes [provider]    Allergies as of 01/01/2018  . (No Known Allergies)    Family History  Problem Relation Age of Onset  . Other Mother        died age 11  . Other Father        mva  . Colon cancer Neg Hx     Social History   Socioeconomic History  . Marital status: Married    Spouse name: Not on file  . Number of children: Not on file  . Years of education: Not on file  . Highest education level: Not on file  Occupational History  . Not on file  Social Needs  . Financial resource strain: Not on file  . Food insecurity:    Worry: Not on file    Inability: Not on file  . Transportation needs:    Medical: Not on file    Non-medical: Not on file  Tobacco Use  . Smoking status: Current Every Day Smoker    Packs/day: 0.50    Years: 50.00    Pack years: 25.00    Types: Cigarettes  . Smokeless tobacco:  Never Used  Substance and Sexual Activity  . Alcohol use: Yes    Comment: 12-15 cans of beer/week  . Drug use: Never  . Sexual activity: Never  Lifestyle  . Physical activity:    Days per week: Not on file    Minutes per session: Not on file  . Stress: Not on file  Relationships  . Social connections:    Talks on phone: Not on file    Gets together: Not on file    Attends religious service: Not on file    Active member of club or organization: Not on file    Attends meetings of clubs or organizations: Not on file    Relationship status: Not on file  . Intimate partner violence:    Fear of current or ex partner: Not on file    Emotionally abused: Not on file    Physically abused: Not on file    Forced sexual activity: Not on file  Other Topics Concern  . Not on file  Social History Narrative  . Not on file    Review of Systems: See HPI, otherwise negative ROS   Physical Exam: BP (!) 148/99   Pulse 92  Temp 98.6 F (37 C) (Oral)   Resp 19   Ht 5' 10"  (1.778 m)   Wt 78 kg   SpO2 97%   BMI 24.68 kg/m  General:   Alert,  pleasant and cooperative in NAD Head:  Normocephalic and atraumatic. Neck:  Supple; Lungs:  Clear throughout to auscultation.    Heart:  Regular rate and rhythm. Abdomen:  Soft, nontender and nondistended. Normal bowel sounds, without guarding, and without rebound.   Neurologic:  Alert and  oriented x4;  grossly normal neurologically.  Impression/Plan:     POS COLOGUARD TEST.   PLAN: 1. TCS TODAY.  DISCUSSED PROCEDURE, BENEFITS, & RISKS: < 1% chance of medication reaction, bleeding, perforation, or rupture of spleen/liver.

## 2018-03-24 NOTE — Op Note (Signed)
[facility] Patient Name: Dillon Walton Procedure Date: 03/24/2018 7:12 AM MRN: [id number] Date of Birth: 1946/05/15 Attending MD: Barney Drain MD, MD CSN: [id number] Age: 72 Admit Type: Outpatient Procedure:                Colonoscopy with EMR & COLD SNARE/SNARE CAUTERY                            POLYPECTOMY, INJECTION Indications:              Positive Cologuard test Providers:                Barney Drain MD, MD, Otis Peak B. Sharon Seller, RN,                            Randa Spike, Technician Referring MD:             Mertie Clause Medicines:                Propofol per Anesthesia Complications:            No immediate complications. Estimated Blood Loss:     Estimated blood loss was minimal. Procedure:                Pre-Anesthesia Assessment:                           - Prior to the procedure, a History and Physical                            was performed, and patient medications and                            allergies were reviewed. The patient's tolerance of                            previous anesthesia was also reviewed. The risks                            and benefits of the procedure and the sedation                            options and risks were discussed with the patient.                            All questions were answered, and informed consent                            was obtained. Prior Anticoagulants: The patient has                            taken no previous anticoagulant or antiplatelet                            agents. ASA Grade Assessment: I - A normal, healthy  patient. After reviewing the risks and benefits,                            the patient was deemed in satisfactory condition to                            undergo the procedure. After obtaining informed                            consent, the colonoscope was passed under direct                            vision. Throughout the procedure, the patient's                             blood pressure, pulse, and oxygen saturations were                            monitored continuously. The CF-[id number] ([id number])                            scope was introduced through the anus and advanced                            to the the cecum, identified by appendiceal orifice                            and ileocecal valve. The colonoscopy was somewhat                            difficult due to significant looping. Successful                            completion of the procedure was aided by                            straightening and shortening the scope to obtain                            bowel loop reduction and COLOWRAP. The patient                            tolerated the procedure well. The quality of the                            bowel preparation was excellent. The ileocecal                            valve, appendiceal orifice, and rectum were                            photographed. Scope In: 8:92:11 AM Scope Out: 8:33:02 AM Scope Withdrawal Time: 0 hours 41 minutes 0 seconds  Total Procedure Duration: 0 hours  46 minutes 4 seconds  Findings:      A 25 mm polyp was found in the rectum. The polyp was semi-pedunculated.       Area was successfully injected with 3 mL of a 1:10,000 solution of       epinephrine for a lift polypectomy. The polyp was removed with a hot       snare. Resection and retrieval were complete. To prevent bleeding after       the polypectomy, two hemostatic clips were successfully placed (MR       conditional). There was no bleeding at the end of the procedure. Area       was tattooed with an injection of 1 mL of Spot (carbon black).      Five sessile polyps were found in the recto-sigmoid colon, splenic       flexure, hepatic flexure(2) and ascending colon. The polyps were 3 to 8       mm in size. These polyps were removed with a cold snare. Resection and       retrieval were complete. Coagulation for hemostasis of bleeding caused        by the procedure using snare was successful.      Two sessile polyps were found in the descending colon. The polyps were 5       to 7 mm in size. These polyps were removed with a hot snare. Resection       and retrieval were complete.      Multiple small and large-mouthed diverticula were found in the       recto-sigmoid colon, sigmoid colon, descending colon and transverse       colon.      External and internal hemorrhoids were found. The hemorrhoids were large. Impression:               - One 25 mm polyp in the rectum, removed with a hot                            snare. Resected and retrieved. Injected. Clips (MR                            conditional) were placed. Tattooed.                           - Five 3 to 8 mm polyps at the recto-sigmoid colon,                            at the splenic flexure, at the hepatic flexure and                            in the ascending colon, removed with a cold snare.                            Resected and retrieved. Treated with a hot snare.                           - Two 5 to 7 mm polyps in the descending colon,  removed with a hot snare. Resected and retrieved.                           - Diverticulosis in the recto-sigmoid colon, in the                            sigmoid colon, in the descending colon and in the                            transverse colon.                           - External and internal hemorrhoids. Moderate Sedation:      Per Anesthesia Care Recommendation:           - Patient has a contact number available for                            emergencies. The signs and symptoms of potential                            delayed complications were discussed with the                            patient. Return to normal activities tomorrow.                            Written discharge instructions were provided to the                            patient.                           - High fiber diet.                            - Continue present medications.                           - Await pathology results.                           - Repeat colonoscopy 1-3 YEARS for surveillance.                            [employer]REE RELATIVES NEED TSC AT AGE 41. Procedure Code(s):        --- Professional ---                           [phone], Colonoscopy, flexible; with removal of                            tumor(s), polyp(s), or other lesion(s) by snare                            technique  [id number], Colonoscopy, flexible; with directed                            submucosal injection(s), any substance Diagnosis Code(s):        --- Professional ---                           K62.1, Rectal polyp                           D12.7, Benign neoplasm of rectosigmoid junction                           D12.3, Benign neoplasm of transverse colon (hepatic                            flexure or splenic flexure)                           D12.2, Benign neoplasm of ascending colon                           D12.4, Benign neoplasm of descending colon                           K64.8, Other hemorrhoids                           R19.5, Other fecal abnormalities                           K57.30, Diverticulosis of large intestine without                            perforation or abscess without bleeding CPT copyright 2017 American Medical Association. All rights reserved. The codes documented in this report are preliminary and upon coder review may  be revised to meet current compliance requirements. Barney Drain, MD Barney Drain MD, MD 03/24/2018 8:48:26 AM This report has been signed electronically. Number of Addenda: 0

## 2018-03-24 NOTE — Transfer of Care (Signed)
Immediate Anesthesia Transfer of Care Note  Patient: Dillon Party Sr.  Procedure(s) Performed: COLONOSCOPY WITH PROPOFOL (N/A ) POLYPECTOMY  Patient Location: PACU  Anesthesia Type:MAC  Level of Consciousness: awake, alert  and oriented  Airway & Oxygen Therapy: Patient Spontanous Breathing  Post-op Assessment: Report given to RN  Post vital signs: Reviewed and stable  Last Vitals:  Vitals Value Taken Time  BP 127/80 03/24/2018  8:42 AM  Temp    Pulse 78 03/24/2018  8:43 AM  Resp 19 03/24/2018  8:43 AM  SpO2 97 % 03/24/2018  8:43 AM  Vitals shown include unvalidated device data.  Last Pain:  Vitals:   03/24/18 0737  TempSrc:   PainSc: 0-No pain      Patients Stated Pain Goal: 9 (82/80/03 4917)  Complications: No apparent anesthesia complications

## 2018-03-24 NOTE — Anesthesia Preprocedure Evaluation (Addendum)
Anesthesia Evaluation  Patient identified by MRN, date of birth, ID band Patient awake    Reviewed: Allergy & Precautions, NPO status , Patient's Chart, lab work & pertinent test results  Airway Mallampati: II  TM Distance: >3 FB Neck ROM: Full    Dental no notable dental hx.    Pulmonary neg pulmonary ROS, Current Smoker,    Pulmonary exam normal breath sounds clear to auscultation       Cardiovascular Exercise Tolerance: Good negative cardio ROS Normal cardiovascular examI Rhythm:Regular Rate:Normal     Neuro/Psych negative neurological ROS  negative psych ROS   GI/Hepatic negative GI ROS, Neg liver ROS,   Endo/Other  negative endocrine ROS  Renal/GU negative Renal ROS  negative genitourinary   Musculoskeletal negative musculoskeletal ROS (+) Arthritis , Osteoarthritis,    Abdominal   Peds negative pediatric ROS (+)  Hematology negative hematology ROS (+)   Anesthesia Other Findings H/o ? Tic bite ~30 days ago - was Tx'd - now resolved   Reproductive/Obstetrics negative OB ROS                             Anesthesia Physical Anesthesia Plan  ASA: II  Anesthesia Plan: General   Post-op Pain Management:    Induction: Intravenous  PONV Risk Score and Plan:   Airway Management Planned: Nasal Cannula  Additional Equipment:   Intra-op Plan:   Post-operative Plan: Extubation in OR  Informed Consent: I have reviewed the patients History and Physical, chart, labs and discussed the procedure including the risks, benefits and alternatives for the proposed anesthesia with the patient or authorized representative who has indicated his/her understanding and acceptance.   Dental advisory given  Plan Discussed with: CRNA  Anesthesia Plan Comments:        Anesthesia Quick Evaluation

## 2018-03-24 NOTE — Anesthesia Postprocedure Evaluation (Signed)
Anesthesia Post Note  Patient: Deante Blough Sr.  Procedure(s) Performed: COLONOSCOPY WITH PROPOFOL (N/A ) POLYPECTOMY  Patient location during evaluation: PACU Anesthesia Type: General Level of consciousness: awake and alert and oriented Pain management: pain level controlled Vital Signs Assessment: post-procedure vital signs reviewed and stable Respiratory status: spontaneous breathing Cardiovascular status: blood pressure returned to baseline and stable Postop Assessment: no apparent nausea or vomiting Anesthetic complications: no     Last Vitals:  Vitals:   03/24/18 0654  BP: (!) 148/99  Pulse: 92  Resp: 19  Temp: 37 C  SpO2: 97%    Last Pain:  Vitals:   03/24/18 0737  TempSrc:   PainSc: 0-No pain                 Kailynn Satterly

## 2018-03-25 ENCOUNTER — Telehealth: Payer: Self-pay | Admitting: Gastroenterology

## 2018-03-25 NOTE — Telephone Encounter (Signed)
PT's wife Hassan Rowan is aware.

## 2018-03-25 NOTE — Telephone Encounter (Signed)
Please call pt. 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   IF YOU NEED AN MRI, LET THE RADIOLOGY TECH KNOW THAT YOU HAD TWO CLIPS PLACED IN YOUR RECTUM. THEY SHOULD FALL OFF IN 30 DAYS.   DRINK WATER TO KEEP YOUR URINE LIGHT YELLOW.  FOLLOW A HIGH FIBER DIET. AVOID ITEMS THAT CAUSE BLOATING.   USE PREPARATION H FOUR TIMES  A DAY IF NEEDED TO RELIEVE RECTAL PAIN/PRESSURE/BLEEDING.  HE NEEDS A FLEX SIG IN 3-6 MOS. HE MAY TAKE ENEMA IN PREOP.  HE NEEDS A colonoscopy in 3 years. HIS SISTERS, BROTHERS, CHILDREN, AND PARENTS NEED TO HAVE A COLONOSCOPY STARTING AT THE AGE OF 40.

## 2018-03-26 DIAGNOSIS — H348312 Tributary (branch) retinal vein occlusion, right eye, stable: Secondary | ICD-10-CM | POA: Diagnosis not present

## 2018-03-26 DIAGNOSIS — H353223 Exudative age-related macular degeneration, left eye, with inactive scar: Secondary | ICD-10-CM | POA: Diagnosis not present

## 2018-03-26 DIAGNOSIS — H353112 Nonexudative age-related macular degeneration, right eye, intermediate dry stage: Secondary | ICD-10-CM | POA: Diagnosis not present

## 2018-03-26 NOTE — Telephone Encounter (Signed)
Reminder in epic °

## 2018-03-26 NOTE — Telephone Encounter (Signed)
Manuela Schwartz, can you please NIC flex sig for 3-6 months as stated below per [facility]. Thanks

## 2018-04-20 ENCOUNTER — Encounter (HOSPITAL_COMMUNITY): Payer: Self-pay | Admitting: Gastroenterology

## 2018-05-07 ENCOUNTER — Telehealth: Payer: Self-pay | Admitting: Gastroenterology

## 2018-05-07 NOTE — Telephone Encounter (Signed)
On recall for 3-6 month repeat flex sig

## 2018-05-07 NOTE — Telephone Encounter (Signed)
Tried to call pt, no answer, LMOVM for return call.  

## 2018-05-08 ENCOUNTER — Other Ambulatory Visit: Payer: Self-pay

## 2018-05-08 DIAGNOSIS — K621 Rectal polyp: Secondary | ICD-10-CM

## 2018-05-08 DIAGNOSIS — K635 Polyp of colon: Secondary | ICD-10-CM

## 2018-05-08 NOTE — Telephone Encounter (Signed)
Called and spoke to pt's wife. She gave ok to schedule flex sig but she's not sure if the pt will do it. States he got upset because he had to pay some for his colonoscopy. Informed her to check with insurance company and gave her CPT code. Insurance company doesn't need PA. Flex sig w/Propofol w/SLF scheduled for 08/04/18 at 10:45am. Per [facility] result note from [facility], he may take enema in pre-op. Will mail procedure instructions after pre-op appt is scheduled.

## 2018-05-13 ENCOUNTER — Telehealth: Payer: Self-pay | Admitting: Gastroenterology

## 2018-05-13 NOTE — Telephone Encounter (Signed)
See other phone note for today. Instructions/appt letter mailed to [address].

## 2018-05-13 NOTE — Telephone Encounter (Signed)
PATIENT CALLED BACK AND I TOLD HER THE DATE OF THE PRE-OP,  SHE VERIFIES THE CORRECT ADDRESS WAS THE PO BOX AND I TOLD HER THEY WILL GET THE INSTRUCTION IN THE MAIL

## 2018-05-13 NOTE — Telephone Encounter (Signed)
Instructions mailed.

## 2018-05-13 NOTE — Telephone Encounter (Signed)
Tried to call pt's wife, call went straight to VM, LMOVM and informed of pre-op appt 07/28/18. Asked her to call office to verify mailing address (po box and street address listed in chart).

## 2018-06-17 ENCOUNTER — Ambulatory Visit: Payer: Medicare HMO | Admitting: Gastroenterology

## 2018-07-02 ENCOUNTER — Telehealth: Payer: Self-pay | Admitting: *Deleted

## 2018-07-02 NOTE — Telephone Encounter (Signed)
Called patient and spoke with Spouse Hassan Rowan. Made her aware [facility] would not be in the OR on 08/04/18. She was agreeable to having [facility] perform patient procedure. Same date/time. Called melanie in endo and made aware.

## 2018-07-28 ENCOUNTER — Encounter (HOSPITAL_COMMUNITY)
Admission: RE | Admit: 2018-07-28 | Discharge: 2018-07-28 | Disposition: A | Discharge status: Home / Self Care | Payer: Managed Care, Other (non HMO) | Source: Ambulatory Visit | Attending: Internal Medicine | Admitting: Internal Medicine

## 2018-07-29 HISTORY — PX: CATARACT EXTRACTION W/ INTRAOCULAR LENS IMPLANT: SHX1309

## 2018-08-04 ENCOUNTER — Encounter (HOSPITAL_COMMUNITY)
Admission: RE | Disposition: A | Discharge status: Home / Self Care | Payer: Self-pay | Source: Home / Self Care | Attending: Internal Medicine

## 2018-08-04 ENCOUNTER — Ambulatory Visit (HOSPITAL_COMMUNITY)
Admission: RE | Admit: 2018-08-04 | Discharge: 2018-08-04 | Disposition: A | Discharge status: Home / Self Care | Payer: Medicare HMO | Attending: Internal Medicine | Admitting: Internal Medicine

## 2018-08-04 ENCOUNTER — Ambulatory Visit (HOSPITAL_COMMUNITY): Payer: Medicare HMO | Admitting: Anesthesiology

## 2018-08-04 ENCOUNTER — Encounter (HOSPITAL_COMMUNITY): Payer: Self-pay | Admitting: Anesthesiology

## 2018-08-04 DIAGNOSIS — D125 Benign neoplasm of sigmoid colon: Secondary | ICD-10-CM | POA: Diagnosis not present

## 2018-08-04 DIAGNOSIS — M1990 Unspecified osteoarthritis, unspecified site: Secondary | ICD-10-CM | POA: Insufficient documentation

## 2018-08-04 DIAGNOSIS — K621 Rectal polyp: Secondary | ICD-10-CM | POA: Diagnosis not present

## 2018-08-04 DIAGNOSIS — Z1211 Encounter for screening for malignant neoplasm of colon: Secondary | ICD-10-CM | POA: Diagnosis not present

## 2018-08-04 DIAGNOSIS — F17210 Nicotine dependence, cigarettes, uncomplicated: Secondary | ICD-10-CM | POA: Insufficient documentation

## 2018-08-04 DIAGNOSIS — Z86010 Personal history of colonic polyps: Secondary | ICD-10-CM

## 2018-08-04 DIAGNOSIS — K635 Polyp of colon: Secondary | ICD-10-CM

## 2018-08-04 DIAGNOSIS — R69 Illness, unspecified: Secondary | ICD-10-CM | POA: Diagnosis not present

## 2018-08-04 HISTORY — PX: POLYPECTOMY: SHX5525

## 2018-08-04 HISTORY — PX: FLEXIBLE SIGMOIDOSCOPY: SHX5431

## 2018-08-04 SURGERY — SIGMOIDOSCOPY, FLEXIBLE
Anesthesia: Monitor Anesthesia Care

## 2018-08-04 MED ORDER — MIDAZOLAM HCL 2 MG/2ML IJ SOLN
INTRAMUSCULAR | Status: AC
  Filled 2018-08-04: qty 2

## 2018-08-04 MED ORDER — LACTATED RINGERS IV SOLN: INTRAVENOUS | Status: DC

## 2018-08-04 MED ORDER — PANTOPRAZOLE SODIUM 40 MG PO TBEC: 40.0000 mg | DELAYED_RELEASE_TABLET | Freq: Two times a day (BID) | ORAL | Status: DC

## 2018-08-04 MED ORDER — HYDROCODONE-ACETAMINOPHEN 7.5-325 MG PO TABS: 1.0000 | ORAL_TABLET | Freq: Once | ORAL | Status: DC | PRN

## 2018-08-04 MED ORDER — STERILE WATER FOR IRRIGATION IR SOLN
Status: DC | PRN
  Administered 2018-08-04: 100 mL

## 2018-08-04 MED ORDER — HYDROMORPHONE HCL 1 MG/ML IJ SOLN: 0.2500 mg | INTRAMUSCULAR | Status: DC | PRN

## 2018-08-04 MED ORDER — CHLORHEXIDINE GLUCONATE CLOTH 2 % EX PADS: 6.0000 | MEDICATED_PAD | Freq: Once | CUTANEOUS | Status: DC

## 2018-08-04 MED ORDER — PROMETHAZINE HCL 25 MG/ML IJ SOLN: 6.2500 mg | INTRAMUSCULAR | Status: DC | PRN

## 2018-08-04 MED ORDER — PROPOFOL 10 MG/ML IV BOLUS
INTRAVENOUS | Status: AC
  Filled 2018-08-04: qty 20

## 2018-08-04 MED ORDER — PROPOFOL 500 MG/50ML IV EMUL
INTRAVENOUS | Status: DC | PRN
  Administered 2018-08-04: 135 ug/kg/min via INTRAVENOUS

## 2018-08-04 MED ORDER — PROPOFOL 10 MG/ML IV BOLUS
INTRAVENOUS | Status: DC | PRN
  Administered 2018-08-04: 15 mg via INTRAVENOUS
  Administered 2018-08-04: 30 mg via INTRAVENOUS

## 2018-08-04 MED ORDER — LACTATED RINGERS IV SOLN
INTRAVENOUS | Status: DC | PRN
  Administered 2018-08-04: 09:00:00 via INTRAVENOUS

## 2018-08-04 MED ORDER — MIDAZOLAM HCL 5 MG/5ML IJ SOLN
INTRAMUSCULAR | Status: DC | PRN
  Administered 2018-08-04: 2 mg via INTRAVENOUS

## 2018-08-04 MED ORDER — MEPERIDINE HCL 100 MG/ML IJ SOLN: 6.2500 mg | INTRAMUSCULAR | Status: DC | PRN

## 2018-08-04 NOTE — Anesthesia Postprocedure Evaluation (Signed)
Anesthesia Post Note  Patient: [name]  Procedure(s) Performed: FLEXIBLE SIGMOIDOSCOPY WITH PROPOFOL (N/A ) POLYPECTOMY  Patient location during evaluation: PACU Anesthesia Type: MAC Level of consciousness: awake and patient cooperative Pain management: pain level controlled Vital Signs Assessment: post-procedure vital signs reviewed and stable Respiratory status: spontaneous breathing, nonlabored ventilation and respiratory function stable Cardiovascular status: blood pressure returned to baseline Postop Assessment: no apparent nausea or vomiting Anesthetic complications: no     Last Vitals:  Vitals:   08/04/18 0816 08/04/18 1036  BP: 132/87 (P) 95/80  Pulse: 93   Resp: 18   Temp: (!) 36.3 C (P) 36.6 C  SpO2: 98% (P) 98%    Last Pain:  Vitals:   08/04/18 1029  TempSrc:   PainSc: 0-No pain                 Onesty Clair J

## 2018-08-04 NOTE — Transfer of Care (Signed)
Immediate Anesthesia Transfer of Care Note  Patient: [name]  Procedure(s) Performed: FLEXIBLE SIGMOIDOSCOPY WITH PROPOFOL (N/A ) POLYPECTOMY  Patient Location: PACU  Anesthesia Type:MAC  Level of Consciousness: awake and patient cooperative  Airway & Oxygen Therapy: Patient Spontanous Breathing and Patient connected to nasal cannula oxygen  Post-op Assessment: Report given to RN, Post -op Vital signs reviewed and stable and Patient moving all extremities  Post vital signs: Reviewed and stable  Last Vitals:  Vitals Value Taken Time  BP    Temp    Pulse    Resp    SpO2      Last Pain:  Vitals:   08/04/18 1029  TempSrc:   PainSc: 0-No pain      Patients Stated Pain Goal: 4 (92/33/00 7622)  Complications: No apparent anesthesia complications

## 2018-08-04 NOTE — Anesthesia Preprocedure Evaluation (Signed)
Anesthesia Evaluation    Airway Mallampati: III       Dental  (+) Teeth Intact, Dental Advidsory Given   Pulmonary Current Smoker,    breath sounds clear to auscultation       Cardiovascular  Rhythm:regular     Neuro/Psych    GI/Hepatic   Endo/Other    Renal/GU      Musculoskeletal   Abdominal   Peds  Hematology   Anesthesia Other Findings Ongoing tobacco abuse No scripts   Reproductive/Obstetrics                             Anesthesia Physical Anesthesia Plan  ASA: II  Anesthesia Plan: MAC   Post-op Pain Management:    Induction:   PONV Risk Score and Plan:   Airway Management Planned:   Additional Equipment:   Intra-op Plan:   Post-operative Plan:   Informed Consent: I have reviewed the patients History and Physical, chart, labs and discussed the procedure including the risks, benefits and alternatives for the proposed anesthesia with the patient or authorized representative who has indicated his/her understanding and acceptance.   Dental Advisory Given  Plan Discussed with: Anesthesiologist  Anesthesia Plan Comments:         Anesthesia Quick Evaluation

## 2018-08-04 NOTE — Discharge Instructions (Signed)
Sigmoidoscopy Discharge Instructions  Read the instructions outlined below and refer to this sheet in the next few weeks. These discharge instructions provide you with general information on caring for yourself after you leave the hospital. Your doctor may also give you specific instructions. While your treatment has been planned according to the most current medical practices available, unavoidable complications occasionally occur. If you have any problems or questions after discharge, call Dr. Gala Romney at [phone]. ACTIVITY  You may resume your regular activity, but move at a slower pace for the next 24 hours.   Take frequent rest periods for the next 24 hours.   Walking will help get rid of the air and reduce the bloated feeling in your belly (abdomen).   No driving for 24 hours (because of the medicine (anesthesia) used during the test).    Do not sign any important legal documents or operate any machinery for 24 hours (because of the anesthesia used during the test).  NUTRITION  Drink plenty of fluids.   You may resume your normal diet as instructed by your doctor.   Begin with a light meal and progress to your normal diet. Heavy or fried foods are harder to digest and may make you feel sick to your stomach (nauseated).   Avoid alcoholic beverages for 24 hours or as instructed.  MEDICATIONS  You may resume your normal medications unless your doctor tells you otherwise.  WHAT YOU CAN EXPECT TODAY  Some feelings of bloating in the abdomen.   Passage of more gas than usual.   Spotting of blood in your stool or on the toilet paper.  IF YOU HAD POLYPS REMOVED DURING THE COLONOSCOPY:  No aspirin products for 7 days or as instructed.   No alcohol for 7 days or as instructed.   Eat a soft diet for the next 24 hours.  FINDING OUT THE RESULTS OF YOUR TEST Not all test results are available during your visit. If your test results are not back during the visit, make an appointment  with your caregiver to find out the results. Do not assume everything is normal if you have not heard from your caregiver or the medical facility. It is important for you to follow up on all of your test results.  SEEK IMMEDIATE MEDICAL ATTENTION IF:  You have more than a spotting of blood in your stool.   Your belly is swollen (abdominal distention).   You are nauseated or vomiting.   You have a temperature over 101.   You have abdominal pain or discomfort that is severe or gets worse throughout the day.  Colon Polyps  Polyps are tissue growths inside the body. Polyps can grow in many places, including the large intestine (colon). A polyp may be a round bump or a mushroom-shaped growth. You could have one polyp or several. Most colon polyps are noncancerous (benign). However, some colon polyps can become cancerous over time. Finding and removing the polyps early can help prevent this. What are the causes? The exact cause of colon polyps is not known. What increases the risk? You are more likely to develop this condition if you:  Have a family history of colon cancer or colon polyps.  Are older than 45 or older than 45 if you are African American.  Have inflammatory bowel disease, such as ulcerative colitis or Crohn's disease.  Have certain hereditary conditions, such as: ? Familial adenomatous polyposis. ? Lynch syndrome. ? Turcot syndrome. ? Peutz-Jeghers syndrome.  Are overweight.  Smoke cigarettes.  Do not get enough exercise.  Drink too much alcohol.  Eat a diet that is high in fat and red meat and low in fiber.  Had childhood cancer that was treated with abdominal radiation. What are the signs or symptoms? Most polyps do not cause symptoms. If you have symptoms, they may include:  Blood coming from your rectum when having a bowel movement.  Blood in your stool. The stool may look dark red or black.  Abdominal pain.  A change in bowel habits, such as  constipation or diarrhea. How is this diagnosed? This condition is diagnosed with a colonoscopy. This is a procedure in which a lighted, flexible scope is inserted into the anus and then passed into the colon to examine the area. Polyps are sometimes found when a colonoscopy is done as part of routine cancer screening tests. How is this treated? Treatment for this condition involves removing any polyps that are found. Most polyps can be removed during a colonoscopy. Those polyps will then be tested for cancer. Additional treatment may be needed depending on the results of testing. Follow these instructions at home: Lifestyle  Maintain a healthy weight, or lose weight if recommended by your health care provider.  Exercise every day or as told by your health care provider.  Do not use any products that contain nicotine or tobacco, such as cigarettes and e-cigarettes. If you need help quitting, ask your health care provider.  If you drink alcohol, limit how much you have: ? 0-1 drink a day for women. ? 0-2 drinks a day for men.  Be aware of how much alcohol is in your drink. In the [place] one drink equals one 12 oz bottle of beer (355 mL), one 5 oz glass of wine (148 mL), or one 1 oz shot of hard liquor (44 mL). Eating and drinking   Eat foods that are high in fiber, such as fruits, vegetables, and whole grains.  Eat foods that are high in calcium and vitamin D, such as milk, cheese, yogurt, eggs, liver, fish, and broccoli.  Limit foods that are high in fat, such as fried foods and desserts.  Limit the amount of red meat and processed meat you eat, such as hot dogs, sausage, bacon, and lunch meats. General instructions  Keep all follow-up visits as told by your health care provider. This is important. ? This includes having regularly scheduled colonoscopies. ? Talk to your health care provider about when you need a colonoscopy. Contact a health care provider if:  You have new or  worsening bleeding during a bowel movement.  You have new or increased blood in your stool.  You have a change in bowel habits.  You lose weight for no known reason. Summary  Polyps are tissue growths inside the body. Polyps can grow in many places, including the colon.  Most colon polyps are noncancerous (benign), but some can become cancerous over time.  This condition is diagnosed with a colonoscopy.  Treatment for this condition involves removing any polyps that are found. Most polyps can be removed during a colonoscopy. This information is not intended to replace advice given to you by your health care provider. Make sure you discuss any questions you have with your health care provider. Document Released: 04/10/2004 Document Revised: 10/30/2017 Document Reviewed: 10/30/2017 Elsevier Interactive Patient Education  2019 [employer].     Further recommendations to follow pending review of pathology report

## 2018-08-04 NOTE — Op Note (Signed)
[facility] Patient Name: Dillon Walton Procedure Date: 08/04/2018 9:41 AM MRN: [id number] Date of Birth: 08-22-1945 Attending MD: Norvel Richards , MD CSN: [id number] Age: 73 Admit Type: Outpatient Procedure:                Flexible Sigmoidoscopy Indications:              High risk colon cancer surveillance: Personal                            history of colonic polyps, -advanced Providers:                Norvel Richards, MD, Janeece Riggers, RN, Aram Candela Referring MD:             Barney Drain MD, MD Medicines:                Propofol per Anesthesia Complications:            No immediate complications. Estimated Blood Loss:     Estimated blood loss was minimal. Procedure:                Pre-Anesthesia Assessment:                           - Prior to the procedure, a History and Physical                            was performed, and patient medications and                            allergies were reviewed. The patient's tolerance of                            previous anesthesia was also reviewed. The risks                            and benefits of the procedure and the sedation                            options and risks were discussed with the patient.                            All questions were answered, and informed consent                            was obtained. Prior Anticoagulants: The patient has                            taken no previous anticoagulant or antiplatelet                            agents. ASA Grade Assessment: II - A patient with  mild systemic disease. After reviewing the risks                            and benefits, the patient was deemed in                            satisfactory condition to undergo the procedure.                           After obtaining informed consent, the scope was                            passed under direct vision. The PCF-H190DL     ([id number]) scope was introduced through the and                            advanced to the the sigmoid colon. The flexible                            sigmoidoscopy was accomplished without difficulty.                            The patient tolerated the procedure well. The                            quality of the bowel preparation was adequate. Scope In: 10:16:31 AM Scope Out: 10:28:34 AM Total Procedure Duration: 0 hours 12 minutes 3 seconds  Findings:      Site of prior polypectomy site - rectum - identified readily with       tattoo. Please see photos. There was a 4 mm sessile polyp at the       periphery of the tattoo margin. Please see photos. No other residual       polyp tissue seen. The colonoscope was advanced in a nice one-to-one       fashion to 45 cm. Could not advance further due to lack of prep. The       only abnormality seen was a second polyp?5 mm mid sigmoid. It was       sessile. Sigmoid polyp cold snare removed. The small polyp in the rectum       was hot snare removed. No residual hemostasis clips identified. Impression:               -Sigmoidoscopy to 45 cm. Rectal and sigmoid polyps                            removed. Site of larger rectal polypectomy looks                            good otherwise. Clips gone. Moderate Sedation:      Moderate (conscious) sedation was personally administered by an       anesthesia professional. The following parameters were monitored: oxygen       saturation, heart rate, blood pressure, respiratory rate, EKG, adequacy       of pulmonary ventilation, and response to care. Recommendation:           -  Advance diet as tolerated. Follow-up on                            pathology. Further recommendations to follow. Procedure Code(s):        --- Professional ---                           [id number], Sigmoidoscopy, flexible; diagnostic,                            including collection of specimen(s) by brushing or                             washing, when performed (separate procedure) Diagnosis Code(s):        --- Professional ---                           Z86.010, Personal history of colonic polyps CPT copyright 2018 American Medical Association. All rights reserved. The codes documented in this report are preliminary and upon coder review may  be revised to meet current compliance requirements. Cristopher Estimable. Tylique Aull, MD Norvel Richards, MD 08/04/2018 10:40:40 AM This report has been signed electronically. Number of Addenda: 0

## 2018-08-04 NOTE — H&P (Signed)
@LOGO @   Primary Care Physician:  Allwardt, Alyssa, PA Primary Gastroenterologist:  Dr. Oneida Alar  Pre-Procedure History & Physical: HPI:  Dillon Keena Sr. is a 73 y.o. male here for for surveillance sigmoidoscopy.  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 Oneida Alar in August.  Reviewed pathology and I reviewed the procedure note.  Had a patient a large rectal polyp removed clipped and tattooed.  Also rectosigmoid polyps it appears these were submitted together in the rectum and rectosigmoid there was tubulovillous adenoma with high-grade dysplasia but margins were clear.  He is here for early follow-up.  He has not had any lower GI tract symptoms since his last colonoscopy.  Due to unforeseen circumstances Dr. fields not available today.  I have been asked to stand in.  I discussed this with the patient patient's wife.  All parties agreeable.  Past Medical History:  Diagnosis Date  . Arthritis   . Eye hemorrhage, right   . Lyme disease 01/2018    Past Surgical History:  Procedure Laterality Date  . COLONOSCOPY WITH PROPOFOL N/A 03/24/2018   Procedure: COLONOSCOPY WITH PROPOFOL;  Surgeon: Danie Binder, MD;  Location: [facility];  Service: Endoscopy;  Laterality: N/A;  7:30am  . HERNIA REPAIR  1985  . POLYPECTOMY  03/24/2018   Procedure: POLYPECTOMY;  Surgeon: Danie Binder, MD;  Location: [facility];  Service: Endoscopy;;  colon  . TONSILLECTOMY      Prior to Admission medications   Medication Sig Start Date End Date Taking? Authorizing Provider  Multiple Vitamin (MULTIVITAMIN) tablet Take 1 tablet by mouth daily.   Yes [provider]  Multiple Vitamins-Minerals (VISION FORMULA EYE HEALTH PO) Take 1 capsule by mouth 2 (two) times daily.    Yes [provider]  vitamin C (ASCORBIC ACID) 500 MG tablet Take 500 mg by mouth daily.    Yes [provider]    Allergies as of 05/08/2018  .  (No Known Allergies)    Family History  Problem Relation Age of Onset  . Other Mother        died age 77  . Other Father        mva  . Colon cancer Neg Hx     Social History   Socioeconomic History  . Marital status: Married    Spouse name: Not on file  . Number of children: Not on file  . Years of education: Not on file  . Highest education level: Not on file  Occupational History  . Not on file  Social Needs  . Financial resource strain: Not on file  . Food insecurity:    Worry: Not on file    Inability: Not on file  . Transportation needs:    Medical: Not on file    Non-medical: Not on file  Tobacco Use  . Smoking status: Current Every Day Smoker    Packs/day: 0.50    Years: 50.00    Pack years: 25.00    Types: Cigarettes  . Smokeless tobacco: Never Used  Substance and Sexual Activity  . Alcohol use: Yes    Comment: 12-15 cans of beer/week  . Drug use: Never  . Sexual activity: Never  Lifestyle  . Physical activity:    Days per week: Not on file    Minutes per session: Not on file  . Stress: Not on file  Relationships  . Social connections:    Talks on  phone: Not on file    Gets together: Not on file    Attends religious service: Not on file    Active member of club or organization: Not on file    Attends meetings of clubs or organizations: Not on file    Relationship status: Not on file  . Intimate partner violence:    Fear of current or ex partner: Not on file    Emotionally abused: Not on file    Physically abused: Not on file    Forced sexual activity: Not on file  Other Topics Concern  . Not on file  Social History Narrative  . Not on file    Review of Systems: See HPI, otherwise negative ROS  Physical Exam: BP 132/87   Pulse 93   Temp (!) 97.4 F (36.3 C) (Oral)   Resp 18   SpO2 98%  General:   Alert,  Well-developed, well-nourished, pleasant and cooperative in NAD Neck:  Supple; no masses or thyromegaly. No significant cervical  adenopathy. Lungs:  Clear throughout to auscultation.   No wheezes, crackles, or rhonchi. No acute distress. Heart:  Regular rate and rhythm; no murmurs, clicks, rubs,  or gallops. Abdomen: Non-distended, normal bowel sounds.  Soft and nontender without appreciable mass or hepatosplenomegaly.  Pulses:  Normal pulses noted. Extremities:  Without clubbing or edema.  Impression/Plan:   73 year old gentleman with history of advanced adenomas removed in the rectum and rectosigmoid.  High-grade dysplasia present but margins clear.  Sigmoidoscopy now being done to reassess those polypectomy sites to assure complete resection. The risks, benefits, limitations, alternatives and imponderables have been reviewed with the patient. Questions have been answered. All parties are agreeable.   Notice: This dictation was prepared with Dragon dictation along with smaller phrase technology. Any transcriptional errors that result from this process are unintentional and may not be corrected upon review.

## 2018-08-06 ENCOUNTER — Encounter: Payer: Self-pay | Admitting: Internal Medicine

## 2018-08-07 ENCOUNTER — Encounter (HOSPITAL_COMMUNITY): Payer: Self-pay | Admitting: Internal Medicine

## 2018-09-25 DIAGNOSIS — Z6824 Body mass index (BMI) 24.0-24.9, adult: Secondary | ICD-10-CM | POA: Diagnosis not present

## 2018-09-25 DIAGNOSIS — Z23 Encounter for immunization: Secondary | ICD-10-CM | POA: Diagnosis not present

## 2018-09-25 DIAGNOSIS — Z0000 Encounter for general adult medical examination without abnormal findings: Secondary | ICD-10-CM | POA: Diagnosis not present

## 2018-09-25 DIAGNOSIS — H35362 Drusen (degenerative) of macula, left eye: Secondary | ICD-10-CM | POA: Diagnosis not present

## 2018-09-25 DIAGNOSIS — R69 Illness, unspecified: Secondary | ICD-10-CM | POA: Diagnosis not present

## 2018-10-20 DIAGNOSIS — N400 Enlarged prostate without lower urinary tract symptoms: Secondary | ICD-10-CM | POA: Diagnosis not present

## 2018-10-20 DIAGNOSIS — R972 Elevated prostate specific antigen [PSA]: Secondary | ICD-10-CM | POA: Diagnosis not present

## 2018-11-11 DIAGNOSIS — C61 Malignant neoplasm of prostate: Secondary | ICD-10-CM | POA: Diagnosis not present

## 2018-11-11 DIAGNOSIS — R972 Elevated prostate specific antigen [PSA]: Secondary | ICD-10-CM | POA: Diagnosis not present

## 2018-11-25 DIAGNOSIS — C61 Malignant neoplasm of prostate: Secondary | ICD-10-CM | POA: Diagnosis not present

## 2020-09-06 ENCOUNTER — Other Ambulatory Visit: Payer: Self-pay | Admitting: Urology

## 2020-09-06 DIAGNOSIS — C61 Malignant neoplasm of prostate: Secondary | ICD-10-CM

## 2020-09-06 DIAGNOSIS — R972 Elevated prostate specific antigen [PSA]: Secondary | ICD-10-CM

## 2020-10-01 ENCOUNTER — Ambulatory Visit
Admission: RE | Admit: 2020-10-01 | Discharge: 2020-10-01 | Disposition: A | Discharge status: Home / Self Care | Payer: Medicare HMO | Source: Ambulatory Visit | Attending: Urology | Admitting: Urology

## 2020-10-01 ENCOUNTER — Other Ambulatory Visit: Payer: Self-pay

## 2020-10-01 DIAGNOSIS — R972 Elevated prostate specific antigen [PSA]: Secondary | ICD-10-CM

## 2020-10-01 DIAGNOSIS — C61 Malignant neoplasm of prostate: Secondary | ICD-10-CM

## 2020-10-01 MED ORDER — GADOBENATE DIMEGLUMINE 529 MG/ML IV SOLN
15.0000 mL | Freq: Once | INTRAVENOUS | Status: AC | PRN
  Administered 2020-10-01: 15 mL via INTRAVENOUS

## 2020-10-25 ENCOUNTER — Other Ambulatory Visit: Payer: Self-pay

## 2020-10-25 ENCOUNTER — Encounter: Payer: Self-pay | Admitting: Nurse Practitioner

## 2020-10-25 ENCOUNTER — Ambulatory Visit: Payer: Medicare HMO | Admitting: Nurse Practitioner

## 2020-10-25 VITALS — BP 172/92 | HR 61 | Temp 97.1°F | Ht 70.0 in | Wt 162.8 lb

## 2020-10-25 DIAGNOSIS — K635 Polyp of colon: Secondary | ICD-10-CM | POA: Diagnosis not present

## 2020-10-25 DIAGNOSIS — Z0000 Encounter for general adult medical examination without abnormal findings: Secondary | ICD-10-CM

## 2020-10-25 DIAGNOSIS — K621 Rectal polyp: Secondary | ICD-10-CM

## 2020-10-25 MED ORDER — PEG 3350-KCL-NA BICARB-NACL 420 G PO SOLR: 4000.0000 mL | ORAL | 0 refills | Status: DC

## 2020-10-25 NOTE — Patient Instructions (Signed)
Your health issues we discussed today were:   Need for colonoscopy: 1. We will schedule your colonoscopy for you 2. Further recommendations will follow your colonoscopy 3. Let us know if you have any concerning or worsening symptoms  Overall I recommend:  1. Continue other current medications 2. Return for follow-up based on recommendations made after colonoscopy 3. Call us for any questions or concerns 4. Best of luck with your prostate biopsy and treatment!!!   ---------------------------------------------------------------  I am glad you have gotten your COVID-19 vaccination!  Even though you are fully vaccinated you should continue to follow CDC and state/local guidelines.  ---------------------------------------------------------------   At [facility] we value your feedback. You may receive a survey about your visit today. Please share your experience as we strive to create trusting relationships with our patients to provide genuine, compassionate, quality care.  We appreciate your understanding and patience as we review any laboratory studies, imaging, and other diagnostic tests that are ordered as we care for you. Our office policy is 5 business days for review of these results, and any emergent or urgent results are addressed in a timely manner for your best interest. If you do not hear from our office in 1 week, please contact us.   We also encourage the use of MyChart, which contains your medical information for your review as well. If you are not enrolled in this feature, an access code is on this after visit summary for your convenience. Thank you for allowing [place] to be involved in your care.  It was great to see you today!  I hope you have a great spring!!

## 2020-10-25 NOTE — Progress Notes (Signed)
Cc'ed to pcp °

## 2020-10-25 NOTE — H&P (View-Only) (Signed)
Referring Provider: Allwardt, Randa Evens, PA-C Primary Care Physician:  Wanita Chamberlain, PA-C Primary GI:  Dr. Abbey Chatters  Chief Complaint  Patient presents with  . Consult    Due for TCS. Last done 2019. Flex sig 2020. Drinks coffee in the mornings and that helps him have BM    HPI:   Azion Centrella Sr. is a 75 y.o. male who presents to schedule a colonoscopy.  The patient was last seen in our office 01/01/2018 for positive Cologuard.  At that time noted never had a colonoscopy before.  Some incomplete emptying of stools, although stools are soft.  No family history of colon cancer.  Recommended a colonoscopy for further evaluation.  Colonoscopy completed 03/24/2018 which found a 25 mm polyp in the rectum that was resected, injected, MR conditional clips placed.  The area was also tattooed.  An additional five 3 8 mm polyps were found in the rectosigmoid colon, splenic flexure, hepatic flexure, ascending colon.  Another two 5 7 mm polyps in the descending colon.  Also noted diverticulosis, external and internal hemorrhoids.  Recommended high-fiber diet.  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  Recommended repeat flexible sigmoidoscopy in 3 to 6 months and colonoscopy in 3 years and all primary relatives need to have a colonoscopy starting at age 62.  Flex sig was completed 08/04/2018 which found a sigmoidoscopy to 45 cm with rectal and sigmoid polyps removed, site of larger rectal polyp looks good otherwise and clips are noted to be colon.  There was a second 5 mm mid sigmoid polyp that was removed.  Surgical pathology found the polyps to be a mix of tubular adenoma and hyperplastic.  Today states he is doing okay overall. He has been diagnosed with prostate cancer and they are planning to do a biopsy and then decide on treatment  options. His son is also going through liver cancer. Denies abdominal pain, N/V, hematochezia, melena, fever, chills, unintentional weight loss. Denies URI or flu-like symptoms. Denies loss of sense of taste or smell. The patient has received COVID-19 vaccination(s). They have had a booster dose. Denies chest pain, dyspnea, dizziness, lightheadedness, syncope, near syncope. Denies any other upper or lower GI symptoms.  Past Medical History:  Diagnosis Date  . Arthritis   . Eye hemorrhage, right   . Lyme disease 01/2018  . Prostate CA [facility])     Past Surgical History:  Procedure Laterality Date  . COLONOSCOPY WITH PROPOFOL N/A 03/24/2018   Procedure: COLONOSCOPY WITH PROPOFOL;  Surgeon: Danie Binder, MD;  [facility];  Service: Endoscopy;  Laterality: N/A;  7:30am  . FLEXIBLE SIGMOIDOSCOPY N/A 08/04/2018   Procedure: FLEXIBLE SIGMOIDOSCOPY WITH PROPOFOL;  Surgeon: Daneil Dolin, MD;  [facility];  Service: Endoscopy;  Laterality: N/A;  10:45am  . HERNIA REPAIR  1985  . POLYPECTOMY  03/24/2018   Procedure: POLYPECTOMY;  Surgeon: Danie Binder, MD;  [facility];  Service: Endoscopy;;  colon  . POLYPECTOMY  08/04/2018   Procedure: POLYPECTOMY;  Surgeon: Daneil Dolin, MD;  [facility];  Service: Endoscopy;;  sigmoid and rectal polyp  . TONSILLECTOMY      Current Outpatient Medications  Medication Sig Dispense Refill  . cholecalciferol (VITAMIN D3) 25 MCG (1000 UNIT) tablet Take 1,000 Units by mouth daily. As needed    .  diphenhydramine-acetaminophen (TYLENOL PM) 25-500 MG TABS tablet Take 2 tablets by mouth at bedtime as needed.    . Multiple Vitamin (MULTIVITAMIN) tablet Take 1 tablet by mouth daily.    . Multiple Vitamins-Minerals (VISION FORMULA EYE HEALTH PO) Take 1 capsule by mouth 2 (two) times daily.     . vitamin C (ASCORBIC ACID) 500 MG tablet Take 500 mg by mouth daily.      No current facility-administered medications for this  visit.    Allergies as of 10/25/2020  . (No Known Allergies)    Family History  Problem Relation Age of Onset  . Other Mother        died age 30  . Other Father        mva  . Colon cancer Neg Hx     Social History   Socioeconomic History  . Marital status: Married    Spouse name: Not on file  . Number of children: Not on file  . Years of education: Not on file  . Highest education level: Not on file  Occupational History  . Not on file  Tobacco Use  . Smoking status: Current Every Day Smoker    Packs/day: 0.50    Years: 50.00    Pack years: 25.00    Types: Cigarettes  . Smokeless tobacco: Never Used  Vaping Use  . Vaping Use: Never used  Substance and Sexual Activity  . Alcohol use: Yes    Comment: 6-10 beer cans per week  . Drug use: Never  . Sexual activity: Never  Other Topics Concern  . Not on file  Social History Narrative  . Not on file   Social Determinants of Health   Financial Resource Strain: Not on file  Food Insecurity: Not on file  Transportation Needs: Not on file  Physical Activity: Not on file  Stress: Not on file  Social Connections: Not on file    Subjective: Review of Systems  Constitutional: Negative for chills, fever, malaise/fatigue and weight loss.  HENT: Negative for congestion and sore throat.   Respiratory: Negative for cough and shortness of breath.   Cardiovascular: Negative for chest pain and palpitations.  Gastrointestinal: Negative for abdominal pain, blood in stool, diarrhea, melena, nausea and vomiting.  Musculoskeletal: Negative for joint pain and myalgias.  Skin: Negative for rash.  Neurological: Negative for dizziness and weakness.  Endo/Heme/Allergies: Does not bruise/bleed easily.  Psychiatric/Behavioral: Negative for depression. The patient is not nervous/anxious.   All other systems reviewed and are negative.    Objective: BP (!) 172/92   Pulse 61   Temp (!) 97.1 F (36.2 C)   Ht _0  (1.778 m)   Wt  162 lb 12.8 oz (73.8 kg)   BMI 23.36 kg/m  Physical Exam Vitals and nursing note reviewed.  Constitutional:      General: He is not in acute distress.    Appearance: Normal appearance. He is normal weight. He is not ill-appearing, toxic-appearing or diaphoretic.  HENT:     Head: Normocephalic and atraumatic.     Nose: No congestion or rhinorrhea.  Eyes:     General: No scleral icterus. Cardiovascular:     Rate and Rhythm: Normal rate and regular rhythm.     Heart sounds: Normal heart sounds.  Pulmonary:     Effort: Pulmonary effort is normal.     Breath sounds: Normal breath sounds.  Abdominal:     General: Bowel sounds are normal. There is no distension.  Palpations: Abdomen is soft. There is no hepatomegaly, splenomegaly or mass.     Tenderness: There is no abdominal tenderness. There is no guarding or rebound.     Hernia: No hernia is present.  Musculoskeletal:     Cervical back: Neck supple.  Skin:    General: Skin is warm and dry.     Coloration: Skin is not jaundiced.     Findings: No bruising or rash.  Neurological:     General: No focal deficit present.     Mental Status: He is alert and oriented to person, place, and time. Mental status is at baseline.  Psychiatric:        Mood and Affect: Mood normal.        Behavior: Behavior normal.        Thought Content: Thought content normal.      Assessment:  Pleasant 75 year old male who presents to schedule a follow-up colonoscopy.  He previously had a very large rectal polyp status post removal and verified 6 months later with flexible sigmoidoscopy.  Also numerous colon polyps necessitating 3-year repeat colonoscopy.  Procedures as outlined in HPI.  At this point he is generally asymptomatic from a GI standpoint.  We will proceed with scheduling.  Of note, he was diagnosed with prostate cancer and is has an upcoming/to be scheduled prostate biopsy with decisions afterward on treatment.  Urology is requesting  colonoscopy prior to his decision making to help guide treatment.   Proceed with TCS on propofol/MAC with Dr. Abbey Chatters on propofol/MAC in near future: the risks, benefits, and alternatives have been discussed with the patient in detail. The patient states understanding and desires to proceed.  ASA II   Plan: 1. Colonoscopy as described above 2. Further recommendations to follow 3. Follow-up based on post procedure recommendations    Thank you for allowing [place] to participate in the care of [name]  Walden Field, DNP, AGNP-C Adult & Gerontological Nurse Practitioner [facility]   10/25/2020 2:51 PM   Disclaimer: This note was dictated with voice recognition software. Similar sounding words can inadvertently be transcribed and may not be corrected upon review.

## 2020-10-25 NOTE — Progress Notes (Signed)
Referring Provider: Allwardt, Randa Evens, PA-C Primary Care Physician:  Wanita Chamberlain, PA-C Primary GI:  Dr. Abbey Chatters  Chief Complaint  Patient presents with  . Consult    Due for TCS. Last done 2019. Flex sig 2020. Drinks coffee in the mornings and that helps him have BM    HPI:   Dillon Fatheree Sr. is a 75 y.o. male who presents to schedule a colonoscopy.  The patient was last seen in our office 01/01/2018 for positive Cologuard.  At that time noted never had a colonoscopy before.  Some incomplete emptying of stools, although stools are soft.  No family history of colon cancer.  Recommended a colonoscopy for further evaluation.  Colonoscopy completed 03/24/2018 which found a 25 mm polyp in the rectum that was resected, injected, MR conditional clips placed.  The area was also tattooed.  An additional five 3 8 mm polyps were found in the rectosigmoid colon, splenic flexure, hepatic flexure, ascending colon.  Another two 5 7 mm polyps in the descending colon.  Also noted diverticulosis, external and internal hemorrhoids.  Recommended high-fiber diet.  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  Recommended repeat flexible sigmoidoscopy in 3 to 6 months and colonoscopy in 3 years and all primary relatives need to have a colonoscopy starting at age 29.  Flex sig was completed 08/04/2018 which found a sigmoidoscopy to 45 cm with rectal and sigmoid polyps removed, site of larger rectal polyp looks good otherwise and clips are noted to be colon.  There was a second 5 mm mid sigmoid polyp that was removed.  Surgical pathology found the polyps to be a mix of tubular adenoma and hyperplastic.  Today states he is doing okay overall. He has been diagnosed with prostate cancer and they are planning to do a biopsy and then decide on treatment  options. His son is also going through liver cancer. Denies abdominal pain, N/V, hematochezia, melena, fever, chills, unintentional weight loss. Denies URI or flu-like symptoms. Denies loss of sense of taste or smell. The patient has received COVID-19 vaccination(s). They have had a booster dose. Denies chest pain, dyspnea, dizziness, lightheadedness, syncope, near syncope. Denies any other upper or lower GI symptoms.  Past Medical History:  Diagnosis Date  . Arthritis   . Eye hemorrhage, right   . Lyme disease 01/2018  . Prostate CA [facility])     Past Surgical History:  Procedure Laterality Date  . COLONOSCOPY WITH PROPOFOL N/A 03/24/2018   Procedure: COLONOSCOPY WITH PROPOFOL;  Surgeon: Danie Binder, MD;  [facility];  Service: Endoscopy;  Laterality: N/A;  7:30am  . FLEXIBLE SIGMOIDOSCOPY N/A 08/04/2018   Procedure: FLEXIBLE SIGMOIDOSCOPY WITH PROPOFOL;  Surgeon: Daneil Dolin, MD;  [facility];  Service: Endoscopy;  Laterality: N/A;  10:45am  . HERNIA REPAIR  1985  . POLYPECTOMY  03/24/2018   Procedure: POLYPECTOMY;  Surgeon: Danie Binder, MD;  [facility];  Service: Endoscopy;;  colon  . POLYPECTOMY  08/04/2018   Procedure: POLYPECTOMY;  Surgeon: Daneil Dolin, MD;  [facility];  Service: Endoscopy;;  sigmoid and rectal polyp  . TONSILLECTOMY      Current Outpatient Medications  Medication Sig Dispense Refill  . cholecalciferol (VITAMIN D3) 25 MCG (1000 UNIT) tablet Take 1,000 Units by mouth daily. As needed    .  diphenhydramine-acetaminophen (TYLENOL PM) 25-500 MG TABS tablet Take 2 tablets by mouth at bedtime as needed.    . Multiple Vitamin (MULTIVITAMIN) tablet Take 1 tablet by mouth daily.    . Multiple Vitamins-Minerals (VISION FORMULA EYE HEALTH PO) Take 1 capsule by mouth 2 (two) times daily.     . vitamin C (ASCORBIC ACID) 500 MG tablet Take 500 mg by mouth daily.      No current facility-administered medications for this  visit.    Allergies as of 10/25/2020  . (No Known Allergies)    Family History  Problem Relation Age of Onset  . Other Mother        died age 25  . Other Father        mva  . Colon cancer Neg Hx     Social History   Socioeconomic History  . Marital status: Married    Spouse name: Not on file  . Number of children: Not on file  . Years of education: Not on file  . Highest education level: Not on file  Occupational History  . Not on file  Tobacco Use  . Smoking status: Current Every Day Smoker    Packs/day: 0.50    Years: 50.00    Pack years: 25.00    Types: Cigarettes  . Smokeless tobacco: Never Used  Vaping Use  . Vaping Use: Never used  Substance and Sexual Activity  . Alcohol use: Yes    Comment: 6-10 beer cans per week  . Drug use: Never  . Sexual activity: Never  Other Topics Concern  . Not on file  Social History Narrative  . Not on file   Social Determinants of Health   Financial Resource Strain: Not on file  Food Insecurity: Not on file  Transportation Needs: Not on file  Physical Activity: Not on file  Stress: Not on file  Social Connections: Not on file    Subjective: Review of Systems  Constitutional: Negative for chills, fever, malaise/fatigue and weight loss.  HENT: Negative for congestion and sore throat.   Respiratory: Negative for cough and shortness of breath.   Cardiovascular: Negative for chest pain and palpitations.  Gastrointestinal: Negative for abdominal pain, blood in stool, diarrhea, melena, nausea and vomiting.  Musculoskeletal: Negative for joint pain and myalgias.  Skin: Negative for rash.  Neurological: Negative for dizziness and weakness.  Endo/Heme/Allergies: Does not bruise/bleed easily.  Psychiatric/Behavioral: Negative for depression. The patient is not nervous/anxious.   All other systems reviewed and are negative.    Objective: BP (!) 172/92   Pulse 61   Temp (!) 97.1 F (36.2 C)   Ht _0  (1.778 m)   Wt  162 lb 12.8 oz (73.8 kg)   BMI 23.36 kg/m  Physical Exam Vitals and nursing note reviewed.  Constitutional:      General: He is not in acute distress.    Appearance: Normal appearance. He is normal weight. He is not ill-appearing, toxic-appearing or diaphoretic.  HENT:     Head: Normocephalic and atraumatic.     Nose: No congestion or rhinorrhea.  Eyes:     General: No scleral icterus. Cardiovascular:     Rate and Rhythm: Normal rate and regular rhythm.     Heart sounds: Normal heart sounds.  Pulmonary:     Effort: Pulmonary effort is normal.     Breath sounds: Normal breath sounds.  Abdominal:     General: Bowel sounds are normal. There is no distension.  Palpations: Abdomen is soft. There is no hepatomegaly, splenomegaly or mass.     Tenderness: There is no abdominal tenderness. There is no guarding or rebound.     Hernia: No hernia is present.  Musculoskeletal:     Cervical back: Neck supple.  Skin:    General: Skin is warm and dry.     Coloration: Skin is not jaundiced.     Findings: No bruising or rash.  Neurological:     General: No focal deficit present.     Mental Status: He is alert and oriented to person, place, and time. Mental status is at baseline.  Psychiatric:        Mood and Affect: Mood normal.        Behavior: Behavior normal.        Thought Content: Thought content normal.      Assessment:  Pleasant 75 year old male who presents to schedule a follow-up colonoscopy.  He previously had a very large rectal polyp status post removal and verified 6 months later with flexible sigmoidoscopy.  Also numerous colon polyps necessitating 3-year repeat colonoscopy.  Procedures as outlined in HPI.  At this point he is generally asymptomatic from a GI standpoint.  We will proceed with scheduling.  Of note, he was diagnosed with prostate cancer and is has an upcoming/to be scheduled prostate biopsy with decisions afterward on treatment.  Urology is requesting  colonoscopy prior to his decision making to help guide treatment.   Proceed with TCS on propofol/MAC with Dr. Abbey Chatters on propofol/MAC in near future: the risks, benefits, and alternatives have been discussed with the patient in detail. The patient states understanding and desires to proceed.  ASA II   Plan: 1. Colonoscopy as described above 2. Further recommendations to follow 3. Follow-up based on post procedure recommendations    Thank you for allowing [place] to participate in the care of Dillon Party Sr.  Walden Field, DNP, AGNP-C Adult & Gerontological Nurse Practitioner [facility]   10/25/2020 2:51 PM   Disclaimer: This note was dictated with voice recognition software. Similar sounding words can inadvertently be transcribed and may not be corrected upon review.

## 2020-10-25 NOTE — Progress Notes (Signed)
Cc'ed to pcp Karma Ganja)

## 2020-11-03 ENCOUNTER — Other Ambulatory Visit (HOSPITAL_COMMUNITY)
Admission: RE | Admit: 2020-11-03 | Discharge: 2020-11-03 | Disposition: A | Discharge status: Home / Self Care | Payer: Medicare HMO | Source: Ambulatory Visit | Attending: Internal Medicine | Admitting: Internal Medicine

## 2020-11-03 ENCOUNTER — Other Ambulatory Visit: Payer: Self-pay

## 2020-11-03 DIAGNOSIS — Z01812 Encounter for preprocedural laboratory examination: Secondary | ICD-10-CM | POA: Diagnosis present

## 2020-11-03 DIAGNOSIS — Z20822 Contact with and (suspected) exposure to covid-19: Secondary | ICD-10-CM | POA: Diagnosis not present

## 2020-11-03 LAB — SARS CORONAVIRUS 2 (TAT 6-24 HRS): SARS Coronavirus 2: NEGATIVE

## 2020-11-06 ENCOUNTER — Other Ambulatory Visit: Payer: Self-pay

## 2020-11-06 ENCOUNTER — Encounter (HOSPITAL_COMMUNITY): Payer: Self-pay

## 2020-11-06 ENCOUNTER — Ambulatory Visit (HOSPITAL_COMMUNITY): Payer: Medicare HMO | Admitting: Anesthesiology

## 2020-11-06 ENCOUNTER — Encounter (HOSPITAL_COMMUNITY)
Admission: RE | Disposition: A | Discharge status: Home / Self Care | Payer: Self-pay | Source: Home / Self Care | Attending: Internal Medicine

## 2020-11-06 ENCOUNTER — Ambulatory Visit (HOSPITAL_COMMUNITY)
Admission: RE | Admit: 2020-11-06 | Discharge: 2020-11-06 | Disposition: A | Discharge status: Home / Self Care | Payer: Medicare HMO | Attending: Internal Medicine | Admitting: Internal Medicine

## 2020-11-06 DIAGNOSIS — C61 Malignant neoplasm of prostate: Secondary | ICD-10-CM | POA: Insufficient documentation

## 2020-11-06 DIAGNOSIS — K648 Other hemorrhoids: Secondary | ICD-10-CM | POA: Diagnosis not present

## 2020-11-06 DIAGNOSIS — Z86010 Personal history of colonic polyps: Secondary | ICD-10-CM

## 2020-11-06 DIAGNOSIS — Z09 Encounter for follow-up examination after completed treatment for conditions other than malignant neoplasm: Secondary | ICD-10-CM | POA: Diagnosis present

## 2020-11-06 DIAGNOSIS — D122 Benign neoplasm of ascending colon: Secondary | ICD-10-CM | POA: Diagnosis not present

## 2020-11-06 DIAGNOSIS — K6289 Other specified diseases of anus and rectum: Secondary | ICD-10-CM | POA: Diagnosis not present

## 2020-11-06 DIAGNOSIS — K635 Polyp of colon: Secondary | ICD-10-CM

## 2020-11-06 DIAGNOSIS — F17210 Nicotine dependence, cigarettes, uncomplicated: Secondary | ICD-10-CM | POA: Diagnosis not present

## 2020-11-06 HISTORY — PX: POLYPECTOMY: SHX5525

## 2020-11-06 HISTORY — PX: COLONOSCOPY WITH PROPOFOL: SHX5780

## 2020-11-06 HISTORY — PX: BIOPSY: SHX5522

## 2020-11-06 SURGERY — COLONOSCOPY WITH PROPOFOL
Anesthesia: General

## 2020-11-06 MED ORDER — LACTATED RINGERS IV SOLN: INTRAVENOUS | Status: DC

## 2020-11-06 MED ORDER — PROPOFOL 500 MG/50ML IV EMUL
INTRAVENOUS | Status: DC | PRN
  Administered 2020-11-06: 125 ug/kg/min via INTRAVENOUS

## 2020-11-06 MED ORDER — PROPOFOL 10 MG/ML IV BOLUS
INTRAVENOUS | Status: DC | PRN
  Administered 2020-11-06: 100 mg via INTRAVENOUS

## 2020-11-06 NOTE — Anesthesia Preprocedure Evaluation (Addendum)
Anesthesia Evaluation  Patient identified by MRN, date of birth, ID band Patient awake    Reviewed: Allergy & Precautions, NPO status , Patient's Chart, lab work & pertinent test results  History of Anesthesia Complications Negative for: history of anesthetic complications  Airway Mallampati: II  TM Distance: >3 FB Neck ROM: Full    Dental  (+) Dental Advisory Given, Missing, Chipped   Pulmonary Current SmokerPatient did not abstain from smoking.,    Pulmonary exam normal breath sounds clear to auscultation       Cardiovascular Normal cardiovascular exam Rhythm:Regular Rate:Normal     Neuro/Psych negative neurological ROS  negative psych ROS   GI/Hepatic Bowel prep,(+)     substance abuse  alcohol use,   Endo/Other  negative endocrine ROS  Renal/GU negative Renal ROS   Prostate cancer    Musculoskeletal  (+) Arthritis ,   Abdominal   Peds  Hematology negative hematology ROS (+)   Anesthesia Other Findings   Reproductive/Obstetrics                            Anesthesia Physical Anesthesia Plan  ASA: II  Anesthesia Plan: General   Post-op Pain Management:    Induction: Intravenous  PONV Risk Score and Plan: Propofol infusion  Airway Management Planned: Nasal Cannula and Natural Airway  Additional Equipment:   Intra-op Plan:   Post-operative Plan:   Informed Consent: I have reviewed the patients History and Physical, chart, labs and discussed the procedure including the risks, benefits and alternatives for the proposed anesthesia with the patient or authorized representative who has indicated his/her understanding and acceptance.     Dental advisory given  Plan Discussed with: CRNA and Surgeon  Anesthesia Plan Comments:         Anesthesia Quick Evaluation

## 2020-11-06 NOTE — Op Note (Signed)
[facility] Patient Name: Dillon Walton Procedure Date: 11/06/2020 10:34 AM MRN: [id number] Date of Birth: 11-Aug-1945 Attending MD: Elon Alas. Abbey Chatters DO CSN: [id number] Age: 75 Admit Type: Outpatient Procedure:                Colonoscopy Indications:              High risk colon cancer surveillance: Personal                            history of adenoma with high grade dysplasia Providers:                Elon Alas. Abbey Chatters, DO, Gwenlyn Fudge, RN, Dereck Leep, Technician Referring MD:              Medicines:                See the Anesthesia note for documentation of the                            administered medications Complications:            No immediate complications. Estimated Blood Loss:     Estimated blood loss was minimal. Procedure:                Pre-Anesthesia Assessment:                           - The anesthesia plan was to use monitored                            anesthesia care (MAC).                           After obtaining informed consent, the colonoscope                            was passed under direct vision. Throughout the                            procedure, the patient's blood pressure, pulse, and                            oxygen saturations were monitored continuously. The                            [id number]-[id number] ([id number]) scope was introduced through                            the anus and advanced to the the cecum, identified                            by appendiceal orifice and ileocecal valve. The                            colonoscopy was  performed without difficulty. The                            patient tolerated the procedure well. The quality                            of the bowel preparation was evaluated using the                            BBPS [facility] Bowel Preparation Scale) with scores                            of: Right Colon = 2 (minor amount of residual                            staining,  small fragments of stool and/or opaque                            liquid, but mucosa seen well), Transverse Colon = 2                            (minor amount of residual staining, small fragments                            of stool and/or opaque liquid, but mucosa seen                            well) and Left Colon = 2 (minor amount of residual                            staining, small fragments of stool and/or opaque                            liquid, but mucosa seen well). The total BBPS score                            equals 6. The quality of the bowel preparation was                            fair. Scope In: 10:45:41 AM Scope Out: 11:03:19 AM Scope Withdrawal Time: 0 hours 8 minutes 12 seconds  Total Procedure Duration: 0 hours 17 minutes 38 seconds  Findings:      The perianal and digital rectal examinations were normal.      Non-bleeding internal hemorrhoids were found during endoscopy.      A post polypectomy scar and tatoo was found in the rectum. There was no       evidence of the previous polyp. Biopsies were taken with a cold forceps       for histology.      A 8 mm polyp was found in the ascending colon. The polyp was flat. The       polyp was removed with a cold snare. Resection and retrieval were       complete. Impression:               -  Preparation of the colon was fair.                           - Non-bleeding internal hemorrhoids.                           - Post-polypectomy scar in the rectum. Biopsied.                           - One 8 mm polyp in the ascending colon, removed                            with a cold snare. Resected and retrieved. Moderate Sedation:      Per Anesthesia Care Recommendation:           - Patient has a contact number available for                            emergencies. The signs and symptoms of potential                            delayed complications were discussed with the                            patient. Return to normal  activities tomorrow.                            Written discharge instructions were provided to the                            patient.                           - Resume previous diet.                           - Continue present medications.                           - Await pathology results.                           - Repeat colonoscopy in 5 years for surveillance.                           - Return to GI clinic PRN. Procedure Code(s):        --- Professional ---                           [phone], Colonoscopy, flexible; with removal of                            tumor(s), polyp(s), or other lesion(s) by snare                            technique  81859, 53, Colonoscopy, flexible; with biopsy,                            single or multiple Diagnosis Code(s):        --- Professional ---                           Z86.010, Personal history of colonic polyps                           K64.8, Other hemorrhoids                           Z98.890, Other specified postprocedural states                           K63.5, Polyp of colon CPT copyright 2019 American Medical Association. All rights reserved. The codes documented in this report are preliminary and upon coder review may  be revised to meet current compliance requirements. Elon Alas. Abbey Chatters, DO [place] Abbey Chatters, DO 11/06/2020 11:06:48 AM This report has been signed electronically. Number of Addenda: 0

## 2020-11-06 NOTE — Anesthesia Postprocedure Evaluation (Signed)
Anesthesia Post Note  Patient: Dillon Linker Sr.  Procedure(s) Performed: COLONOSCOPY WITH PROPOFOL (N/A ) POLYPECTOMY BIOPSY  Patient location during evaluation: Endoscopy Anesthesia Type: General Level of consciousness: awake and alert and oriented Pain management: pain level controlled Vital Signs Assessment: post-procedure vital signs reviewed and stable Respiratory status: spontaneous breathing and respiratory function stable Cardiovascular status: blood pressure returned to baseline and stable Postop Assessment: no apparent nausea or vomiting Anesthetic complications: no   No complications documented.   Last Vitals:  Vitals:   11/06/20 0948 11/06/20 1109  BP: (!) 167/100 104/70  Pulse: 96   Resp: 18 17  Temp: 36.9 C (!) 36.3 C  SpO2: 98% 97%    Last Pain:  Vitals:   11/06/20 1109  TempSrc: Oral  PainSc: 0-No pain                 Dillon Walton

## 2020-11-06 NOTE — Discharge Instructions (Addendum)
Colonoscopy Discharge Instructions  Read the instructions outlined below and refer to this sheet in the next few weeks. These discharge instructions provide you with general information on caring for yourself after you leave the hospital. Your doctor may also give you specific instructions. While your treatment has been planned according to the most current medical practices available, unavoidable complications occasionally occur.   ACTIVITY  You may resume your regular activity, but move at a slower pace for the next 24 hours.   Take frequent rest periods for the next 24 hours.   Walking will help get rid of the air and reduce the bloated feeling in your belly (abdomen).   No driving for 24 hours (because of the medicine (anesthesia) used during the test).    Do not sign any important legal documents or operate any machinery for 24 hours (because of the anesthesia used during the test).  NUTRITION  Drink plenty of fluids.   You may resume your normal diet as instructed by your doctor.   Begin with a light meal and progress to your normal diet. Heavy or fried foods are harder to digest and may make you feel sick to your stomach (nauseated).   Avoid alcoholic beverages for 24 hours or as instructed.  MEDICATIONS  You may resume your normal medications unless your doctor tells you otherwise.  WHAT YOU CAN EXPECT TODAY  Some feelings of bloating in the abdomen.   Passage of more gas than usual.   Spotting of blood in your stool or on the toilet paper.  IF YOU HAD POLYPS REMOVED DURING THE COLONOSCOPY:  No aspirin products for 7 days or as instructed.   No alcohol for 7 days or as instructed.   Eat a soft diet for the next 24 hours.  FINDING OUT THE RESULTS OF YOUR TEST Not all test results are available during your visit. If your test results are not back during the visit, make an appointment with your caregiver to find out the results. Do not assume everything is normal if  you have not heard from your caregiver or the medical facility. It is important for you to follow up on all of your test results.  SEEK IMMEDIATE MEDICAL ATTENTION IF:  You have more than a spotting of blood in your stool.   Your belly is swollen (abdominal distention).   You are nauseated or vomiting.   You have a temperature over 101.   You have abdominal pain or discomfort that is severe or gets worse throughout the day.   Your colonoscopy revealed 1 polyp(s) which I removed successfully. Await pathology results, my office will contact you. I recommend repeating colonoscopy in 5 years for surveillance purposes.  The previous polypectomy site looked healthy.  I did take biopsies of this to make sure there is no residual polyp tissue left.  I hope you have a great rest of your week!  Elon Alas. Abbey Chatters, D.O. Gastroenterology and Hepatology [facility]   Colon Polyps  Colon polyps are tissue growths inside the colon, which is part of the large intestine. They are one of the types of polyps that can grow in the body. A polyp may be a round bump or a mushroom-shaped growth. You could have one polyp or more than one. Most colon polyps are noncancerous (benign). However, some colon polyps can become cancerous over time. Finding and removing the polyps early can help prevent this. What are the causes? The exact cause of colon polyps is  not known. What increases the risk? The following factors may make you more likely to develop this condition:  Having a family history of colorectal cancer or colon polyps.  Being older than 76 years of age.  Being younger than 77 years of age and having a significant family history of colorectal cancer or colon polyps or a genetic condition that puts you at higher risk of getting colon polyps.  Having inflammatory bowel disease, such as ulcerative colitis or Crohn's disease.  Having certain conditions passed from parent to  child (hereditary conditions), such as: ? Familial adenomatous polyposis (FAP). ? Lynch syndrome. ? Turcot syndrome. ? Peutz-Jeghers syndrome. ? MUTYH-associated polyposis (MAP).  Being overweight.  Certain lifestyle factors. These include smoking cigarettes, drinking too much alcohol, not getting enough exercise, and eating a diet that is high in fat and red meat and low in fiber.  Having had childhood cancer that was treated with radiation of the abdomen. What are the signs or symptoms? Many times, there are no symptoms. If you have symptoms, they may include:  Blood coming from the rectum during a bowel movement.  Blood in the stool (feces). The blood may be bright red or very dark in color.  Pain in the abdomen.  A change in bowel habits, such as constipation or diarrhea. How is this diagnosed? This condition is diagnosed with a colonoscopy. This is a procedure in which a lighted, flexible scope is inserted into the opening between the buttocks (anus) and then passed into the colon to examine the area. Polyps are sometimes found when a colonoscopy is done as part of routine cancer screening tests. How is this treated? This condition is treated by removing any polyps that are found. Most polyps can be removed during a colonoscopy. Those polyps will then be tested for cancer. Additional treatment may be needed depending on the results of testing. Follow these instructions at home: Eating and drinking  Eat foods that are high in fiber, such as fruits, vegetables, and whole grains.  Eat foods that are high in calcium and vitamin D, such as milk, cheese, yogurt, eggs, liver, fish, and broccoli.  Limit foods that are high in fat, such as fried foods and desserts.  Limit the amount of red meat, precooked or cured meat, or other processed meat that you eat, such as hot dogs, sausages, bacon, or meat loaves.  Limit sugary drinks.   Lifestyle  Maintain a healthy weight, or lose  weight if recommended by your health care provider.  Exercise every day or as told by your health care provider.  Do not use any products that contain nicotine or tobacco, such as cigarettes, e-cigarettes, and chewing tobacco. If you need help quitting, ask your health care provider.  Do not drink alcohol if: ? Your health care provider tells you not to drink. ? You are pregnant, may be pregnant, or are planning to become pregnant.  If you drink alcohol: ? Limit how much you use to:  0-1 drink a day for women.  0-2 drinks a day for men. ? Know how much alcohol is in your drink. In the [place] one drink equals one 12 oz bottle of beer (355 mL), one 5 oz glass of wine (148 mL), or one 1 oz glass of hard liquor (44 mL). General instructions  Take over-the-counter and prescription medicines only as told by your health care provider.  Keep all follow-up visits. This is important. This includes having regularly scheduled colonoscopies. Talk to your  health care provider about when you need a colonoscopy. Contact a health care provider if:  You have new or worsening bleeding during a bowel movement.  You have new or increased blood in your stool.  You have a change in bowel habits.  You lose weight for no known reason. Summary  Colon polyps are tissue growths inside the colon, which is part of the large intestine. They are one type of polyp that can grow in the body.  Most colon polyps are noncancerous (benign), but some can become cancerous over time.  This condition is diagnosed with a colonoscopy.  This condition is treated by removing any polyps that are found. Most polyps can be removed during a colonoscopy. This information is not intended to replace advice given to you by your health care provider. Make sure you discuss any questions you have with your health care provider. Document Revised: 11/03/2019 Document Reviewed: 11/03/2019 Elsevier Patient Education  2021 [employer].

## 2020-11-06 NOTE — Transfer of Care (Signed)
Immediate Anesthesia Transfer of Care Note  Patient: Dillon Party Sr.  Procedure(s) Performed: COLONOSCOPY WITH PROPOFOL (N/A ) POLYPECTOMY BIOPSY  Patient Location: Endoscopy Unit  Anesthesia Type:General  Level of Consciousness: awake, alert  and oriented  Airway & Oxygen Therapy: Patient Spontanous Breathing  Post-op Assessment: Report given to RN, Post -op Vital signs reviewed and stable and Patient moving all extremities X 4  Post vital signs: Reviewed and stable  Last Vitals:  Vitals Value Taken Time  BP    Temp    Pulse    Resp    SpO2      Last Pain:  Vitals:   11/06/20 0948  TempSrc: Oral  PainSc: 0-No pain      Patients Stated Pain Goal: 2 (88/50/27 7412)  Complications: No complications documented.

## 2020-11-06 NOTE — Interval H&P Note (Signed)
History and Physical Interval Note:  11/06/2020 10:34 AM  Dillon Megan Salon Sr.  has presented today for surgery, with the diagnosis of history of colon polyps, rectal polyp.  The various methods of treatment have been discussed with the patient and family. After consideration of risks, benefits and other options for treatment, the patient has consented to  Procedure(s) with comments: COLONOSCOPY WITH PROPOFOL (N/A) - PM as a surgical intervention.  The patient's history has been reviewed, patient examined, no change in status, stable for surgery.  I have reviewed the patient's chart and labs.  Questions were answered to the patient's satisfaction.     Eloise Harman

## 2020-11-07 LAB — SURGICAL PATHOLOGY

## 2020-11-09 ENCOUNTER — Encounter (HOSPITAL_COMMUNITY): Payer: Self-pay | Admitting: Internal Medicine

## 2020-12-13 ENCOUNTER — Encounter: Payer: Self-pay | Admitting: Radiation Oncology

## 2020-12-13 NOTE — Progress Notes (Signed)
GU Location of Tumor / Histology: prostatic adenocarcinoma  If Prostate Cancer, Gleason Score is (4 + 3) and PSA is (6.74). Prostate volume: 41 g.  Dillon Megan Salon Sr. was diagnosed with gleason 3+3 prostate cancer on 11/11/2018 and psa of 5.1. He opted for active surveillance.  10/13/20 psa 6.74 04/11/20 psa 4.87 11/02/19  psa 4.80 05/20/19 psa 4.66 09/25/18 psa 5.1 09/24/16 psa 3.7  Biopsies of prostate (if applicable) revealed:   Past/Anticipated interventions by urology, if any: prostate biopsy, active surveillance, prostate biopsy, no imaging ordered, referral to Dr. Tammi Klippel to discuss brachytherapy vs EBRT  Past/Anticipated interventions by medical oncology, if any: no  Weight changes, if any: no  Bowel/Bladder complaints, if any: urinates 3-4 times at night  Denies any pain AUA 8  Nausea/Vomiting, if any: none  Pain issues, if any:  none  SAFETY ISSUES:  Prior radiation? Possibly done on hands for warts 65 years ago not sure  Pacemaker/ICD? no  Possible current pregnancy? no, male patient  Is the patient on methotrexate? no  Current Complaints / other details:  75 year old male. Married. Resides in [place], [place].

## 2020-12-15 ENCOUNTER — Other Ambulatory Visit: Payer: Self-pay

## 2020-12-15 ENCOUNTER — Encounter: Payer: Self-pay | Admitting: Medical Oncology

## 2020-12-15 ENCOUNTER — Ambulatory Visit
Admission: RE | Admit: 2020-12-15 | Discharge: 2020-12-15 | Disposition: A | Discharge status: Home / Self Care | Payer: Medicare HMO | Source: Ambulatory Visit | Attending: Radiation Oncology | Admitting: Radiation Oncology

## 2020-12-15 ENCOUNTER — Encounter: Payer: Self-pay | Admitting: Radiation Oncology

## 2020-12-15 DIAGNOSIS — C61 Malignant neoplasm of prostate: Secondary | ICD-10-CM | POA: Insufficient documentation

## 2020-12-15 DIAGNOSIS — F17210 Nicotine dependence, cigarettes, uncomplicated: Secondary | ICD-10-CM | POA: Diagnosis not present

## 2020-12-15 NOTE — Progress Notes (Signed)
Radiation Oncology         [phone]) [phone] ________________________________  Initial outpatient Consultation  Name: Dillon Party Sr. MRN: [id number]  Date: 12/15/2020  DOB: [birth year]  CC:Dillon Chamberlain, PA-C  Dillon Seal, MD   REFERRING PHYSICIAN: Irine Seal, MD  DIAGNOSIS: 75 y.o. 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    ICD-10-CM   1. Malignant neoplasm of prostate ([place])  C61     HISTORY OF PRESENT ILLNESS: Dillon Andres Sr. is a 75 y.o. male with a diagnosis of prostate cancer. He was initially referred to Dr. Karsten Ro for an elevated PSA of 5.1 in 08/2018. He underwent prostate biopsy on 11/11/18 and was found to have a single, small focus of Gleason 3+3 prostate cancer and appropriately elected to proceed with active surveillance at that time.  His PSA had remained stable in the 4 range over the past 2 years. He underwent surveillance prostate MRI on 10/01/20 showing a 7 mm  PI-RADS 4 lesion along the margin of the capsule at the base on the left and tracking towards the midgland in the lateral peripheral zone.  There was no evidence of extracapsular extension, seminal vesicle involvement, or metastatic disease.  Incidentally noted, there were signs of potential rectal thickening with more profound thickening of the rectum just above the sphincter complex.  The recommendation was for further evaluation with colonoscopy if the patient had not had one recently.  Fortunately, the patient had a colonoscopy in 2020 with Dr. Gala Walton and he has been notified of these findings to determine if any further evaluation is necessary.  At the time of his follow-up visit on 10/13/2020, the PSA was noted to be further elevated at 6.74 and digital rectal exam was abnormal with firmness bilaterally.  Therefore, he proceeded to MRI fusion biopsy on 11/14/20. The prostate volume measured 41 cc.  Out of 15 core biopsies, 4 were positive.  The maximum  Gleason score was 4+3, and this was seen in all three ROI MRI lesion samples and the left mid lateral.  The patient reviewed the biopsy results with his urologist and he has kindly been referred today for discussion of potential radiation treatment options.   PREVIOUS RADIATION THERAPY: Unsure, possibly as a child to his hands for warts. Otherwise, no.  PAST MEDICAL HISTORY:  Past Medical History:  Diagnosis Date  . Arthritis   . Eye hemorrhage, right   . Lyme disease 01/2018  . Prostate CA [facility])       PAST SURGICAL HISTORY: Past Surgical History:  Procedure Laterality Date  . BIOPSY  11/06/2020   Procedure: BIOPSY;  Surgeon: Eloise Harman, DO;  [facility];  Service: Endoscopy;;  . COLONOSCOPY WITH PROPOFOL N/A 03/24/2018   Procedure: COLONOSCOPY WITH PROPOFOL;  Surgeon: Dillon Binder, MD;  [facility];  Service: Endoscopy;  Laterality: N/A;  7:30am  . COLONOSCOPY WITH PROPOFOL N/A 11/06/2020   Procedure: COLONOSCOPY WITH PROPOFOL;  Surgeon: Eloise Harman, DO;  [facility];  Service: Endoscopy;  Laterality: N/A;  PM  . FLEXIBLE SIGMOIDOSCOPY N/A 08/04/2018   Procedure: FLEXIBLE SIGMOIDOSCOPY WITH PROPOFOL;  Surgeon: Dillon Dolin, MD;  [facility];  Service: Endoscopy;  Laterality: N/A;  10:45am  . HERNIA REPAIR  1985  . POLYPECTOMY  03/24/2018   Procedure: POLYPECTOMY;  Surgeon: Dillon Binder, MD;  [facility];  Service: Endoscopy;;  colon  .  POLYPECTOMY  08/04/2018   Procedure: POLYPECTOMY;  Surgeon: Dillon Dolin, MD;  [facility];  Service: Endoscopy;;  sigmoid and rectal polyp  . POLYPECTOMY  11/06/2020   Procedure: POLYPECTOMY;  Surgeon: Eloise Harman, DO;  [facility];  Service: Endoscopy;;  . PROSTATE BIOPSY    . TONSILLECTOMY      FAMILY HISTORY:  Family History  Problem Relation Age of Onset  . Other Mother        died age 98  . Other Father        mva  . Colon cancer  Neg Hx   . Breast cancer Neg Hx   . Prostate cancer Neg Hx   . Pancreatic cancer Neg Hx     SOCIAL HISTORY:  Social History   Socioeconomic History  . Marital status: Married    Spouse name: Not on file  . Number of children: Not on file  . Years of education: Not on file  . Highest education level: Not on file  Occupational History  . Not on file  Tobacco Use  . Smoking status: Current Every Day Smoker    Packs/day: 0.50    Years: 50.00    Pack years: 25.00    Types: Cigarettes  . Smokeless tobacco: Never Used  Vaping Use  . Vaping Use: Never used  Substance and Sexual Activity  . Alcohol use: Yes    Comment: 6-10 beer cans per week  . Drug use: Never  . Sexual activity: Not Currently  Other Topics Concern  . Not on file  Social History Narrative  . Not on file   Social Determinants of Health   Financial Resource Strain: Not on file  Food Insecurity: Not on file  Transportation Needs: Not on file  Physical Activity: Not on file  Stress: Not on file  Social Connections: Not on file  Intimate Partner Violence: Not on file    ALLERGIES: Patient has no known allergies.  MEDICATIONS:  Current Outpatient Medications  Medication Sig Dispense Refill  . Ascorbic Acid (VITAMIN C) 1000 MG tablet Take 1,000 mg by mouth in the morning.    . cholecalciferol (VITAMIN D3) 25 MCG (1000 UNIT) tablet Take 1,000 Units by mouth in the morning. As needed    . diphenhydramine-acetaminophen (TYLENOL PM) 25-500 MG TABS tablet Take 2 tablets by mouth at bedtime.    . Multiple Vitamin (MULTIVITAMIN WITH MINERALS) TABS tablet Take 1 tablet by mouth daily.    . Multiple Vitamins-Minerals (PRESERVISION AREDS 2 PO) Take 1 tablet by mouth in the morning and at bedtime.     No current facility-administered medications for this encounter.    REVIEW OF SYSTEMS:  On review of systems, the patient reports that he is doing well overall. He denies any chest pain, shortness of breath, cough,  fevers, chills, night sweats, unintended weight changes. He denies any bowel disturbances, and denies abdominal pain, nausea or vomiting. He denies any new musculoskeletal or joint aches or pains. His IPSS was 8, indicating mild urinary symptoms. He reports nocturia x3-4. A complete review of systems is obtained and is otherwise negative.    PHYSICAL EXAM:  Wt Readings from Last 3 Encounters:  12/15/20 160 lb 6 oz (72.7 kg)  10/25/20 162 lb 12.8 oz (73.8 kg)  03/24/18 172 lb (78 kg)   Temp Readings from Last 3 Encounters:  12/15/20 (!) 97 F (36.1 C) (Temporal)  11/06/20 (!) 97.4 F (36.3 C) (Oral)  10/25/20 Marland Kitchen)  97.1 F (36.2 C)   BP Readings from Last 3 Encounters:  12/15/20 (!) 148/88  11/06/20 104/70  10/25/20 (!) 172/92   Pulse Readings from Last 3 Encounters:  12/15/20 70  11/06/20 96  10/25/20 61   Pain Assessment Pain Score: 0-No pain/10  In general this is a well appearing Caucasian male in no acute distress. He's alert and oriented x4 and appropriate throughout the examination. Cardiopulmonary assessment is negative for acute distress, and he exhibits normal effort.     KPS = 100  100 - Normal; no complaints; no evidence of disease. 90   - Able to carry on normal activity; minor signs or symptoms of disease. 80   - Normal activity with effort; some signs or symptoms of disease. 51   - Cares for self; unable to carry on normal activity or to do active work. 60   - Requires occasional assistance, but is able to care for most of his personal needs. 50   - Requires considerable assistance and frequent medical care. 9   - Disabled; requires special care and assistance. 73   - Severely disabled; hospital admission is indicated although death not imminent. 33   - Very sick; hospital admission necessary; active supportive treatment necessary. 10   - Moribund; fatal processes progressing rapidly. 0     - Dead  Karnofsky DA, Abelmann Duryea, Craver LS and Burchenal [facility]  [phone]) The use of the nitrogen mustards in the palliative treatment of carcinoma: with particular reference to bronchogenic carcinoma Cancer 1 [phone]  LABORATORY DATA:  Lab Results  Component Value Date   WBC 10.5 03/17/2018   HGB 15.0 03/17/2018   HCT 44.0 03/17/2018   MCV 94.6 03/17/2018   PLT 288 03/17/2018   Lab Results  Component Value Date   NA 139 03/17/2018   K 3.8 03/17/2018   CL 104 03/17/2018   CO2 25 03/17/2018   No results found for: ALT, AST, GGT, ALKPHOS, BILITOT   RADIOGRAPHY: No results found.    IMPRESSION/PLAN: 1. 75 y.o. gentleman with Stage T1c adenocarcinoma of the prostate with Gleason Score of 4+3, and PSA of 6.74. We discussed the patient's workup and outlined the nature of prostate cancer in this setting. The patient's T stage, Gleason's score, and PSA put him into the unfavorable intermediate risk group. Accordingly, he is eligible for a variety of potential treatment options including brachytherapy, 5.5 weeks of external radiation, or prostatectomy. We discussed the available radiation techniques, and focused on the details and logistics of delivery. We discussed and outlined the risks, benefits, short and long-term effects associated with radiotherapy and compared and contrasted these with prostatectomy. We discussed the role of SpaceOAR gel in reducing the rectal toxicity associated with radiotherapy. He appears to have a good understanding of his disease and our treatment recommendations which are of curative intent.  He and his wife were encouraged to ask questions that were answered to their stated satisfaction.  At the conclusion of our conversation, the patient is interested in moving forward with brachytherapy and use of SpaceOAR gel to reduce rectal toxicity from radiotherapy.  We will share our discussion with Dr. Jeffie Pollock and move forward with scheduling his CT [facility] planning appointment in the near future.  The patient met briefly with Romie Jumper in  our office who will be working closely with him to coordinate OR scheduling and pre and post procedure appointments.  We will contact the pharmaceutical rep to ensure that [employer] is available at the  time of procedure.  We enjoyed meeting him and his wife today and look forward to continuing to participate in his care.  We personally spent 75 minutes in this encounter including chart review, reviewing radiological studies, meeting face-to-face with the patient, entering orders and completing documentation.    Nicholos Johns, PA-C    Tyler Pita, MD  [facility] Direct Dial: [phone]  Fax: [phone] [email or web address]  Skype  [email or web address]   This document serves as a record of services personally performed by Tyler Pita, MD and Freeman Caldron, PA-C. It was created on their behalf by Wilburn Mylar, a trained medical scribe. The creation of this record is based on the scribe's personal observations and the provider's statements to them. This document has been checked and approved by the attending provider.

## 2020-12-20 ENCOUNTER — Telehealth: Payer: Self-pay | Admitting: *Deleted

## 2020-12-20 NOTE — Telephone Encounter (Signed)
Called patient to update, spoke with patient's wife - Hassan Rowan and she is aware of the pre-seed appts. for 02-08-21, I will call her back as soon as I have the implant date.

## 2020-12-22 ENCOUNTER — Telehealth: Payer: Self-pay | Admitting: *Deleted

## 2020-12-22 NOTE — Telephone Encounter (Signed)
Called patient's wife- Hassan Rowan to inform of implant date, patient's wife- Hassan Rowan is unable to answer phone will call later

## 2021-01-17 ENCOUNTER — Other Ambulatory Visit: Payer: Self-pay | Admitting: Urology

## 2021-01-17 DIAGNOSIS — C61 Malignant neoplasm of prostate: Secondary | ICD-10-CM

## 2021-01-22 ENCOUNTER — Telehealth: Payer: Self-pay | Admitting: *Deleted

## 2021-01-22 NOTE — Telephone Encounter (Signed)
RETURNED PATIENT'S WIFE'S PHONE CALL- SPOKE WITH PATIENT'S WIFE

## 2021-02-07 ENCOUNTER — Telehealth: Payer: Self-pay | Admitting: *Deleted

## 2021-02-07 NOTE — Telephone Encounter (Signed)
Called patient to remind of pre-seed apps. for 02-08-21, spoke with patient's wife- Hassan Rowan and she is aware of these appts.

## 2021-02-07 NOTE — Progress Notes (Signed)
  Radiation Oncology         [phone]) [phone] ________________________________  Name: Dillon Party Sr. MRN: [id number]  Date: 02/08/2021  DOB: 05/30/1946  SIMULATION AND TREATMENT PLANNING NOTE PUBIC ARCH STUDY  ZR:AQTMA, Marcy Salvo, PA-C  Irine Seal, MD  DIAGNOSIS: 75 y.o. 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  Oncology History  Malignant neoplasm of prostate ([place])  11/14/2020 Cancer Staging   Staging form: Prostate, AJCC 8th Edition - Clinical stage from 11/14/2020: Stage IIC (cT2, cN0, cM0, PSA: 6.7, Grade Group: 3) - Signed by Freeman Caldron, PA-C on 12/15/2020  Histopathologic type: Adenocarcinoma, NOS  Stage prefix: Initial diagnosis  Prostate specific antigen (PSA) range: Less than 10  Gleason primary pattern: 4  Gleason secondary pattern: 3  Gleason score: 7  Histologic grading system: 5 grade system  Number of biopsy cores examined: 15  Number of biopsy cores positive: 4  Location of positive needle core biopsies: One side    12/15/2020 Initial Diagnosis   Malignant neoplasm of prostate ([place])        ICD-10-CM   1. Malignant neoplasm of prostate ([place])  C61       COMPLEX SIMULATION:  The patient presented today for evaluation for possible prostate seed implant. He was brought to the radiation planning suite and placed supine on the CT couch. A 3-dimensional image study set was obtained in upload to the planning computer. There, on each axial slice, I contoured the prostate gland. Then, using three-dimensional radiation planning tools I reconstructed the prostate in view of the structures from the transperineal needle pathway to assess for possible pubic arch interference. In doing so, I did not appreciate any pubic arch interference. Also, the patient's prostate volume was estimated based on the drawn structure. The volume was 40 cc.  Given the pubic arch appearance and prostate volume, patient remains  a good candidate to proceed with prostate seed implant. Today, he freely provided informed written consent to proceed.    PLAN: The patient will undergo prostate seed implant.   ________________________________  Sheral Apley. Tammi Klippel, M.D.

## 2021-02-08 ENCOUNTER — Ambulatory Visit
Admission: RE | Admit: 2021-02-08 | Discharge: 2021-02-08 | Disposition: A | Discharge status: Home / Self Care | Payer: Medicare HMO | Source: Ambulatory Visit | Attending: Radiation Oncology | Admitting: Radiation Oncology

## 2021-02-08 ENCOUNTER — Ambulatory Visit (HOSPITAL_COMMUNITY)
Admission: RE | Admit: 2021-02-08 | Discharge: 2021-02-08 | Disposition: A | Discharge status: Home / Self Care | Payer: Medicare HMO | Source: Ambulatory Visit | Attending: Urology | Admitting: Urology

## 2021-02-08 ENCOUNTER — Encounter: Payer: Self-pay | Admitting: Urology

## 2021-02-08 ENCOUNTER — Ambulatory Visit
Admission: RE | Admit: 2021-02-08 | Discharge: 2021-02-08 | Disposition: A | Discharge status: Home / Self Care | Payer: Medicare HMO | Source: Ambulatory Visit | Attending: Urology | Admitting: Urology

## 2021-02-08 ENCOUNTER — Other Ambulatory Visit: Payer: Self-pay

## 2021-02-08 ENCOUNTER — Encounter (HOSPITAL_COMMUNITY)
Admission: RE | Admit: 2021-02-08 | Discharge: 2021-02-08 | Disposition: A | Discharge status: Home / Self Care | Payer: Medicare HMO | Source: Ambulatory Visit | Attending: Urology | Admitting: Urology

## 2021-02-08 DIAGNOSIS — C61 Malignant neoplasm of prostate: Secondary | ICD-10-CM | POA: Diagnosis present

## 2021-02-08 NOTE — Progress Notes (Signed)
Patient in for pre seed appointment denies any pain at this time.

## 2021-03-20 ENCOUNTER — Encounter (HOSPITAL_BASED_OUTPATIENT_CLINIC_OR_DEPARTMENT_OTHER): Payer: Self-pay | Admitting: Urology

## 2021-03-20 ENCOUNTER — Encounter (HOSPITAL_COMMUNITY)
Admission: RE | Admit: 2021-03-20 | Discharge: 2021-03-20 | Disposition: A | Discharge status: Home / Self Care | Payer: Medicare HMO | Source: Ambulatory Visit | Attending: Urology | Admitting: Urology

## 2021-03-20 ENCOUNTER — Other Ambulatory Visit: Payer: Self-pay

## 2021-03-20 DIAGNOSIS — Z01812 Encounter for preprocedural laboratory examination: Secondary | ICD-10-CM | POA: Insufficient documentation

## 2021-03-20 LAB — CBC
HCT: 43.4 % (ref 39.0–52.0)
Hemoglobin: 14.4 g/dL (ref 13.0–17.0)
MCH: 31.4 pg (ref 26.0–34.0)
MCHC: 33.2 g/dL (ref 30.0–36.0)
MCV: 94.8 fL (ref 80.0–100.0)
Platelets: 303 10*3/uL (ref 150–400)
RBC: 4.58 MIL/uL (ref 4.22–5.81)
RDW: 13.3 % (ref 11.5–15.5)
WBC: 9.8 10*3/uL (ref 4.0–10.5)
nRBC: 0 % (ref 0.0–0.2)

## 2021-03-20 LAB — COMPREHENSIVE METABOLIC PANEL
ALT: 12 U/L (ref 0–44)
AST: 17 U/L (ref 15–41)
Albumin: 4.2 g/dL (ref 3.5–5.0)
Alkaline Phosphatase: 91 U/L (ref 38–126)
Anion gap: 6 (ref 5–15)
BUN: 13 mg/dL (ref 8–23)
CO2: 28 mmol/L (ref 22–32)
Calcium: 9.4 mg/dL (ref 8.9–10.3)
Chloride: 103 mmol/L (ref 98–111)
Creatinine, Ser: 0.87 mg/dL (ref 0.61–1.24)
GFR, Estimated: >60 mL/min (ref >60)
Glucose, Bld: 102 mg/dL — ABNORMAL HIGH (ref 70–99)
Potassium: 5 mmol/L (ref 3.5–5.1)
Sodium: 137 mmol/L (ref 135–145)
Total Bilirubin: 0.8 mg/dL (ref 0.3–1.2)
Total Protein: 7.1 g/dL (ref 6.5–8.1)

## 2021-03-20 LAB — APTT: aPTT: 31 seconds (ref 24–36)

## 2021-03-20 LAB — PROTIME-INR
INR: 0.9 (ref 0.8–1.2)
Prothrombin Time: 12.1 seconds (ref 11.4–15.2)

## 2021-03-20 NOTE — Progress Notes (Addendum)
Spoke w/ via phone for pre-op interview--- Pt's wife, Dillon Walton needs dos--- no Walton results---pt had labs done 03-20-2021 CBC, CMP, PT/ PTT results in epic;  current ekg/ cxr in epic/ chart             COVID test -----patient states asymptomatic no test needed Arrive at -------  1045 on 03-23-2021 NPO after MN NO Solid Food.  Clear liquids from MN until--- 0945 Med rec completed Medications to take morning of surgery ----- NONE Diabetic medication ----- n/a Patient instructed no nail polish to be worn day of surgery Patient instructed to bring photo id and insurance card day of surgery Patient aware to have Driver (ride ) / caregiver for 24 hours after surgery --wife, Dillon Walton Patient Special Instructions ----- will do one fleet enema morning of surgery Pre-Op special Istructions ----- n/a Patient verbalized understanding of instructions that were given at this phone interview. Patient denies shortness of breath, chest pain, fever, cough at this phone interview.

## 2021-03-22 ENCOUNTER — Telehealth: Payer: Self-pay | Admitting: *Deleted

## 2021-03-22 NOTE — Telephone Encounter (Signed)
CALLED PATIENT TO REMIND OF PROCEDURE FOR 03-23-21, SPOKE WITH PATIENT'S WIFE- BRENDA AND SHE IS AWARE OF THIS APPT.

## 2021-03-23 ENCOUNTER — Ambulatory Visit (HOSPITAL_BASED_OUTPATIENT_CLINIC_OR_DEPARTMENT_OTHER)
Admission: RE | Admit: 2021-03-23 | Discharge: 2021-03-23 | Disposition: A | Discharge status: Home / Self Care | Payer: Medicare HMO | Source: Ambulatory Visit | Attending: Urology | Admitting: Urology

## 2021-03-23 ENCOUNTER — Ambulatory Visit (HOSPITAL_BASED_OUTPATIENT_CLINIC_OR_DEPARTMENT_OTHER): Payer: Medicare HMO | Admitting: Anesthesiology

## 2021-03-23 ENCOUNTER — Ambulatory Visit (HOSPITAL_COMMUNITY): Payer: Medicare HMO

## 2021-03-23 ENCOUNTER — Other Ambulatory Visit: Payer: Self-pay

## 2021-03-23 ENCOUNTER — Encounter (HOSPITAL_BASED_OUTPATIENT_CLINIC_OR_DEPARTMENT_OTHER): Payer: Self-pay | Admitting: Urology

## 2021-03-23 ENCOUNTER — Encounter (HOSPITAL_BASED_OUTPATIENT_CLINIC_OR_DEPARTMENT_OTHER)
Admission: RE | Disposition: A | Discharge status: Home / Self Care | Payer: Self-pay | Source: Ambulatory Visit | Attending: Urology

## 2021-03-23 DIAGNOSIS — C61 Malignant neoplasm of prostate: Secondary | ICD-10-CM | POA: Diagnosis not present

## 2021-03-23 DIAGNOSIS — F17200 Nicotine dependence, unspecified, uncomplicated: Secondary | ICD-10-CM | POA: Diagnosis not present

## 2021-03-23 HISTORY — PX: SPACE OAR INSTILLATION: SHX6769

## 2021-03-23 HISTORY — DX: Tributary (branch) retinal vein occlusion, unspecified eye, stable: H34.8392

## 2021-03-23 HISTORY — DX: Presence of spectacles and contact lenses: Z97.3

## 2021-03-23 HISTORY — PX: RADIOACTIVE SEED IMPLANT: SHX5150

## 2021-03-23 HISTORY — DX: Elevated blood-pressure reading, without diagnosis of hypertension: R03.0

## 2021-03-23 HISTORY — PX: CYSTOSCOPY: SHX5120

## 2021-03-23 HISTORY — DX: Unspecified macular degeneration: H35.30

## 2021-03-23 SURGERY — INSERTION, RADIATION SOURCE, PROSTATE
Anesthesia: General | Site: Prostate

## 2021-03-23 MED ORDER — LIDOCAINE HCL (PF) 2 % IJ SOLN
INTRAMUSCULAR | Status: AC
  Filled 2021-03-23: qty 5

## 2021-03-23 MED ORDER — EPHEDRINE SULFATE-NACL 50-0.9 MG/10ML-% IV SOSY
PREFILLED_SYRINGE | INTRAVENOUS | Status: DC | PRN
  Administered 2021-03-23 (×4): 5 mg via INTRAVENOUS

## 2021-03-23 MED ORDER — ACETAMINOPHEN 10 MG/ML IV SOLN: 1000.0000 mg | Freq: Once | INTRAVENOUS | Status: DC | PRN

## 2021-03-23 MED ORDER — FENTANYL CITRATE (PF) 100 MCG/2ML IJ SOLN
INTRAMUSCULAR | Status: AC
  Filled 2021-03-23: qty 2

## 2021-03-23 MED ORDER — ONDANSETRON HCL 4 MG/2ML IJ SOLN
INTRAMUSCULAR | Status: DC | PRN
  Administered 2021-03-23: 4 mg via INTRAVENOUS

## 2021-03-23 MED ORDER — IOHEXOL 300 MG/ML  SOLN
INTRAMUSCULAR | Status: DC | PRN
  Administered 2021-03-23: 7 mL

## 2021-03-23 MED ORDER — ONDANSETRON HCL 4 MG/2ML IJ SOLN: 4.0000 mg | Freq: Once | INTRAMUSCULAR | Status: DC | PRN

## 2021-03-23 MED ORDER — SODIUM CHLORIDE 0.9 % IR SOLN
Status: DC | PRN
  Administered 2021-03-23: 150 mL

## 2021-03-23 MED ORDER — ACETAMINOPHEN 325 MG PO TABS: 650.0000 mg | ORAL_TABLET | ORAL | Status: DC | PRN

## 2021-03-23 MED ORDER — ONDANSETRON HCL 4 MG/2ML IJ SOLN
INTRAMUSCULAR | Status: AC
  Filled 2021-03-23: qty 2

## 2021-03-23 MED ORDER — DEXAMETHASONE SODIUM PHOSPHATE 4 MG/ML IJ SOLN
INTRAMUSCULAR | Status: DC | PRN
  Administered 2021-03-23: 5 mg via INTRAVENOUS

## 2021-03-23 MED ORDER — OXYCODONE HCL 5 MG PO TABS
ORAL_TABLET | ORAL | Status: AC
  Filled 2021-03-23: qty 1

## 2021-03-23 MED ORDER — SODIUM CHLORIDE (PF) 0.9 % IJ SOLN
INTRAMUSCULAR | Status: DC | PRN
  Administered 2021-03-23: 10 mL

## 2021-03-23 MED ORDER — LACTATED RINGERS IV SOLN: INTRAVENOUS | Status: DC

## 2021-03-23 MED ORDER — CIPROFLOXACIN IN D5W 400 MG/200ML IV SOLN
INTRAVENOUS | Status: AC
  Filled 2021-03-23: qty 200

## 2021-03-23 MED ORDER — HYDROCODONE-ACETAMINOPHEN 5-325 MG PO TABS: 1.0000 | ORAL_TABLET | Freq: Four times a day (QID) | ORAL | 0 refills | Status: DC | PRN

## 2021-03-23 MED ORDER — STERILE WATER FOR IRRIGATION IR SOLN
Status: DC | PRN
  Administered 2021-03-23: 3 mL

## 2021-03-23 MED ORDER — FENTANYL CITRATE (PF) 100 MCG/2ML IJ SOLN
INTRAMUSCULAR | Status: DC | PRN
  Administered 2021-03-23: 25 ug via INTRAVENOUS
  Administered 2021-03-23 (×2): 50 ug via INTRAVENOUS
  Administered 2021-03-23 (×3): 25 ug via INTRAVENOUS

## 2021-03-23 MED ORDER — PHENYLEPHRINE 40 MCG/ML (10ML) SYRINGE FOR IV PUSH (FOR BLOOD PRESSURE SUPPORT)
PREFILLED_SYRINGE | INTRAVENOUS | Status: DC | PRN
  Administered 2021-03-23 (×3): 80 ug via INTRAVENOUS

## 2021-03-23 MED ORDER — SODIUM CHLORIDE 0.9 % IV SOLN: 250.0000 mL | INTRAVENOUS | Status: DC | PRN

## 2021-03-23 MED ORDER — CIPROFLOXACIN IN D5W 400 MG/200ML IV SOLN
400.0000 mg | INTRAVENOUS | Status: AC
  Administered 2021-03-23: 400 mg via INTRAVENOUS

## 2021-03-23 MED ORDER — SODIUM CHLORIDE 0.9% FLUSH: 3.0000 mL | Freq: Two times a day (BID) | INTRAVENOUS | Status: DC

## 2021-03-23 MED ORDER — PROPOFOL 500 MG/50ML IV EMUL
INTRAVENOUS | Status: AC
  Filled 2021-03-23: qty 100

## 2021-03-23 MED ORDER — FLEET ENEMA 7-19 GM/118ML RE ENEM: 1.0000 | ENEMA | Freq: Once | RECTAL | Status: DC

## 2021-03-23 MED ORDER — EPHEDRINE 5 MG/ML INJ
INTRAVENOUS | Status: AC
  Filled 2021-03-23: qty 5

## 2021-03-23 MED ORDER — FENTANYL CITRATE (PF) 100 MCG/2ML IJ SOLN: 25.0000 ug | INTRAMUSCULAR | Status: DC | PRN

## 2021-03-23 MED ORDER — SODIUM CHLORIDE 0.9% FLUSH: 3.0000 mL | INTRAVENOUS | Status: DC | PRN

## 2021-03-23 MED ORDER — OXYCODONE HCL 5 MG PO TABS
5.0000 mg | ORAL_TABLET | ORAL | Status: DC | PRN
  Administered 2021-03-23: 5 mg via ORAL

## 2021-03-23 MED ORDER — ACETAMINOPHEN 325 MG RE SUPP: 650.0000 mg | RECTAL | Status: DC | PRN

## 2021-03-23 MED ORDER — LIDOCAINE HCL (CARDIAC) PF 100 MG/5ML IV SOSY
PREFILLED_SYRINGE | INTRAVENOUS | Status: DC | PRN
  Administered 2021-03-23: 60 mg via INTRAVENOUS

## 2021-03-23 MED ORDER — MORPHINE SULFATE (PF) 4 MG/ML IV SOLN
2.0000 mg | INTRAVENOUS | Status: DC | PRN
Start: 2021-03-23 — End: 2021-03-23

## 2021-03-23 MED ORDER — PROPOFOL 10 MG/ML IV BOLUS
INTRAVENOUS | Status: DC | PRN
  Administered 2021-03-23: 40 mg via INTRAVENOUS
  Administered 2021-03-23: 150 mg via INTRAVENOUS
  Administered 2021-03-23: 20 mg via INTRAVENOUS

## 2021-03-23 SURGICAL SUPPLY — 40 items
BAG DRN RND TRDRP ANRFLXCHMBR (UROLOGICAL SUPPLIES) ×2
BAG URINE DRAIN 2000ML AR STRL (UROLOGICAL SUPPLIES) ×3 IMPLANT
BLADE CLIPPER SENSICLIP SURGIC (BLADE) ×3 IMPLANT
CATH FOLEY 2WAY SLVR  5CC 16FR (CATHETERS) ×1
CATH FOLEY 2WAY SLVR 5CC 16FR (CATHETERS) ×2 IMPLANT
CATH ROBINSON RED A/P 16FR (CATHETERS) IMPLANT
CATH ROBINSON RED A/P 20FR (CATHETERS) ×3 IMPLANT
CLOTH BEACON ORANGE TIMEOUT ST (SAFETY) ×3 IMPLANT
CNTNR URN SCR LID CUP LEK RST (MISCELLANEOUS) ×4 IMPLANT
CONT SPEC 4OZ STRL OR WHT (MISCELLANEOUS) ×6
COVER BACK TABLE 60X90IN (DRAPES) ×3 IMPLANT
COVER MAYO STAND STRL (DRAPES) ×3 IMPLANT
DRAPE C-ARM 35X43 STRL (DRAPES) IMPLANT
DRSG TEGADERM 4X4.75 (GAUZE/BANDAGES/DRESSINGS) ×3 IMPLANT
DRSG TEGADERM 8X12 (GAUZE/BANDAGES/DRESSINGS) ×3 IMPLANT
GAUZE SPONGE 4X4 12PLY STRL LF (GAUZE/BANDAGES/DRESSINGS) ×3 IMPLANT
GLOVE SURG ENC MOIS LTX SZ6.5 (GLOVE) ×3 IMPLANT
GLOVE SURG ENC MOIS LTX SZ7.5 (GLOVE) IMPLANT
GLOVE SURG ENC MOIS LTX SZ8 (GLOVE) IMPLANT
GLOVE SURG ORTHO LTX SZ8.5 (GLOVE) ×3 IMPLANT
GLOVE SURG POLYISO LF SZ8 (GLOVE) ×6 IMPLANT
GLOVE SURG UNDER POLY LF SZ6.5 (GLOVE) IMPLANT
GOWN STRL REUS W/TWL LRG LVL3 (GOWN DISPOSABLE) ×3 IMPLANT
GOWN STRL REUS W/TWL XL LVL3 (GOWN DISPOSABLE) ×3 IMPLANT
HOLDER FOLEY CATH W/STRAP (MISCELLANEOUS) IMPLANT
I-Seed Agx100 ×225 IMPLANT
IMPL SPACEOAR VUE SYSTEM (Spacer) ×2 IMPLANT
IMPLANT SPACEOAR VUE SYSTEM (Spacer) ×3 IMPLANT
IV NS 1000ML (IV SOLUTION) ×3
IV NS 1000ML BAXH (IV SOLUTION) ×2 IMPLANT
KIT TURNOVER CYSTO (KITS) ×3 IMPLANT
MARKER SKIN DUAL TIP RULER LAB (MISCELLANEOUS) ×3 IMPLANT
PACK CYSTO (CUSTOM PROCEDURE TRAY) ×3 IMPLANT
SURGILUBE 2OZ TUBE FLIPTOP (MISCELLANEOUS) ×3 IMPLANT
SUT BONE WAX W31G (SUTURE) IMPLANT
SYR 10ML LL (SYRINGE) ×6 IMPLANT
TOWEL OR 17X26 10 PK STRL BLUE (TOWEL DISPOSABLE) ×3 IMPLANT
UNDERPAD 30X36 HEAVY ABSORB (UNDERPADS AND DIAPERS) ×6 IMPLANT
WATER STERILE IRR 3000ML UROMA (IV SOLUTION) IMPLANT
WATER STERILE IRR 500ML POUR (IV SOLUTION) ×3 IMPLANT

## 2021-03-23 NOTE — Anesthesia Preprocedure Evaluation (Signed)
Anesthesia Evaluation  Patient identified by MRN, date of birth, ID band Patient awake    Reviewed: Allergy & Precautions, NPO status , Patient's Chart, lab work & pertinent test results  Airway Mallampati: II  TM Distance: >3 FB Neck ROM: Full    Dental  (+) Teeth Intact   Pulmonary neg pulmonary ROS, Current Smoker and Patient abstained from smoking.,    Pulmonary exam normal        Cardiovascular negative cardio ROS   Rhythm:Regular Rate:Normal     Neuro/Psych negative neurological ROS  negative psych ROS   GI/Hepatic negative GI ROS, Neg liver ROS,   Endo/Other  negative endocrine ROS  Renal/GU negative Renal ROS   Prostate Ca    Musculoskeletal  (+) Arthritis ,   Abdominal (+)  Abdomen: soft. Bowel sounds: normal.  Peds  Hematology negative hematology ROS (+)   Anesthesia Other Findings   Reproductive/Obstetrics                            Anesthesia Physical Anesthesia Plan  ASA: 2  Anesthesia Plan: General   Post-op Pain Management:    Induction: Intravenous  PONV Risk Score and Plan: 1 and Ondansetron, Dexamethasone and Treatment may vary due to age or medical condition  Airway Management Planned: Mask and LMA  Additional Equipment: None  Intra-op Plan:   Post-operative Plan: Extubation in OR  Informed Consent: I have reviewed the patients History and Physical, chart, labs and discussed the procedure including the risks, benefits and alternatives for the proposed anesthesia with the patient or authorized representative who has indicated his/her understanding and acceptance.     Dental advisory given  Plan Discussed with: CRNA  Anesthesia Plan Comments:         Anesthesia Quick Evaluation

## 2021-03-23 NOTE — Progress Notes (Signed)
  Radiation Oncology         [phone]) [phone] ________________________________  Name: Dillon Party Sr. MRN: [id number]  Date: 03/26/2021  DOB: 1946/04/12       Prostate Seed Implant  CC:Boles, Alyssa H, PA-C  No ref. provider found  DIAGNOSIS:  75 y.o. 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, and PSA of 6.74  Oncology History  Malignant neoplasm of prostate ([place])  11/14/2020 Cancer Staging   Staging form: Prostate, AJCC 8th Edition - Clinical stage from 11/14/2020: Stage IIC (cT2, cN0, cM0, PSA: 6.7, Grade Group: 3) - Signed by Freeman Caldron, PA-C on 12/15/2020 Histopathologic type: Adenocarcinoma, NOS Stage prefix: Initial diagnosis Prostate specific antigen (PSA) range: Less than 10 Gleason primary pattern: 4 Gleason secondary pattern: 3 Gleason score: 7 Histologic grading system: 5 grade system Number of biopsy cores examined: 15 Number of biopsy cores positive: 4 Location of positive needle core biopsies: One side   12/15/2020 Initial Diagnosis   Malignant neoplasm of prostate ([place])     No diagnosis found.  PROCEDURE: Insertion of radioactive I-125 seeds into the prostate gland.  RADIATION DOSE: 145 Gy, definitive therapy.  TECHNIQUE: Dillon Gorrie Sr. was brought to the operating room with the urologist. He was placed in the dorsolithotomy position. He was catheterized and a rectal tube was inserted. The perineum was shaved, prepped and draped. The ultrasound probe was then introduced into the rectum to see the prostate gland.  TREATMENT DEVICE: A needle grid was attached to the ultrasound probe stand and anchor needles were placed.  3D PLANNING: The prostate was imaged in 3D using a sagittal sweep of the prostate probe. These images were transferred to the planning computer. There, the prostate, urethra and rectum were defined on each axial reconstructed image. Then, the software created an optimized 3D plan and a few seed  positions were adjusted. The quality of the plan was reviewed using [facility] information for the target and the following two organs at risk:  Urethra and Rectum.  Then the accepted plan was printed and handed off to the radiation therapist.  Under my supervision, the custom loading of the seeds and spacers was carried out and loaded into sealed vicryl sleeves.  These pre-loaded needles were then placed into the needle holder.Marland Kitchen  PROSTATE VOLUME STUDY:  Using transrectal ultrasound the volume of the prostate was verified to be 46.8 cc.  SPECIAL TREATMENT PROCEDURE/SUPERVISION AND HANDLING: The pre-loaded needles were then delivered under sagittal guidance. A total of 19 needles were used to deposit 75 seeds in the prostate gland. The individual seed activity was 0.404 mCi.  SpaceOAR:  Yes  COMPLEX SIMULATION: At the end of the procedure, an anterior radiograph of the pelvis was obtained to document seed positioning and count. Cystoscopy was performed to check the urethra and bladder.  MICRODOSIMETRY: At the end of the procedure, the patient was emitting 0.097 mR/hr at 1 meter. Accordingly, he was considered safe for hospital discharge.  PLAN: The patient will return to the radiation oncology clinic for post implant CT dosimetry in three weeks.   ________________________________  Sheral Apley Tammi Klippel, M.D.

## 2021-03-23 NOTE — Anesthesia Procedure Notes (Signed)
Procedure Name: LMA Insertion Date/Time: 03/23/2021 12:53 PM Performed by: Lieutenant Diego, CRNA Pre-anesthesia Checklist: Patient identified, Emergency Drugs available, Suction available and Patient being monitored Patient Re-evaluated:Patient Re-evaluated prior to induction Oxygen Delivery Method: Circle System Utilized Preoxygenation: Pre-oxygenation with 100% oxygen Induction Type: IV induction Ventilation: Mask ventilation without difficulty LMA: LMA inserted LMA Size: 5.0 Number of attempts: 1 Placement Confirmation: positive ETCO2 Tube secured with: Tape Dental Injury: Teeth and Oropharynx as per pre-operative assessment

## 2021-03-23 NOTE — Op Note (Signed)
PATIENT:  Dillon Party Sr.  PRE-OPERATIVE DIAGNOSIS:  Adenocarcinoma of the prostate  POST-OPERATIVE DIAGNOSIS:  Same  PROCEDURE:  Procedure(s): 1. I-125 radioactive seed implantation 2. SpaceOAR implantation. 3.  Cystoscopy  SURGEON:  Surgeon(s): Irine Seal MD  Radiation oncologist: Dr. Tyler Pita  ANESTHESIA:  General  EBL:  Minimal  DRAINS: 22 French Foley catheter  INDICATION: Dillon Party Sr. is a 75 y.o. with Stage T1c Nx Mx , Gleason Gleason 7(4+3) prostate cancer who has elected brachytherapy for treatment.  Description of procedure: After informed consent the patient was brought to the major OR, placed on the table and administered general anesthesia. He was then moved to the modified lithotomy position with his perineum perpendicular to the floor. His perineum and genitalia were then sterilely prepped. An official timeout was then performed. A 16 French Foley catheter was then placed in the bladder and filled with dilute contrast, a rectal tube was placed in the rectum and the transrectal ultrasound probe was placed in the rectum and affixed to the stand. He was then sterilely draped.  The sterile grid was installed.   Anchor needles were then placed.   Real time ultrasonography was used along with the seed planning software spot-pro version 3.1-00. This was used to develop the seed plan including the number of needles as well as number of seeds required for complete and adequate coverage. Real-time ultrasonography was then used along with the previously developed plan  to implant a total of 75 seeds using 19 needles for a target dose of 145 Gy. This proceeded without difficulty or complication.  The anchor needles and guide were removed and the SpaceOAR needle was passed under US guidance into the fat stripe posterior to the prostate with the tip in the midline at mid prostate. A puff of NS confirmed appropriate positioning and open the space and the SpaceOAR Vue  polymer was then injected over 12 seconds into the space with excellent distribution.     A Foley catheter was then removed as well as the transrectal ultrasound probe and rectal probe. Flexible cystoscopy was then performed using the 17 French flexible scope which revealed a normal urethra throughout its length down to the sphincter which appeared intact. The prostatic urethra was about 2cm with bilobar hyperplasia. The bladder was then entered and fully and systematically inspected.  The ureteral orifices were noted to be of normal configuration and position. The mucosa revealed no evidence of tumors. There were also no stones identified within the bladder.  No seeds or spacers were seen and/or removed from the bladder.  The cystoscope was then removed.  The drapes were removed.  The perineum was cleaned and dressed.  He was taken out of the lithotomy position and was awakened and taken to recovery room in stable and satisfactory condition. He tolerated procedure well and there were no intraoperative complications.

## 2021-03-23 NOTE — Discharge Instructions (Signed)

## 2021-03-23 NOTE — H&P (Signed)
Pt presents today for pre-operative history and physical exam in anticipation of radioactive seed and space oar placement by Dr. Jeffie Pollock on 03/23/21. He is doing well and is without complaint.   Pt denies F/C, HA, CP, SOB, N/V, diarrhea/constipation, back pain, flank pain, hematuria, and dysuria.   HX:     CC: I have prostate cancer.  HPI: Dillon Walton is a 75 year-old male established patient who is here evaluation for treatment of prostate cancer.  Dillon Walton returns today with his wife to discuss his MR fusion biopsy done for an increased in his PSA to 6.74 and a PIRADS 4 lesion in the left prostate. He has been on surveillance and was initially biopsied in 4/20 and found to have a single core with 5% GG1. The recent biopsy demonstrated a 49m prostate. The left ROI had 3/3 cores positive with GG3 disease in 30,30 and 40% of the cores and the LML core had GG3 with 30% involvement. He has stage T1c Nx Mx disease.   [facility] nomogram predicts 58% OCD, 41% ECE, 5% LNI and 4% SVI. The MR lesion cores were counted as one core for purposes of the calculation.   IPSS is 13.   SHIM is 0.   He has no significant comorbidities.     AUA Symptom Score: Less than 50% of the time he has the sensation of not emptying his bladder completely when finished urinating. Less than 50% of the time he has to urinate again fewer than two hours after he has finished urinating. 50% of the time he has to start and stop again several times when he urinates. Less than 20% of the time he finds it difficult to postpone urination. Less than 50% of the time he has a weak urinary stream. He never has to push or strain to begin urination. He has to get up to urinate 3 times from the time he goes to bed until the time he gets up in the morning.   Calculated AUA Symptom Score: 13    ALLERGIES: None   MEDICATIONS: Acetaminophen-Diphenhydramine 500 mg-25 mg tablet  Aspirin Ec 81 mg tablet, delayed release  Levofloxacin 750 mg  tablet 1 po 1 hour prior to the procedure  Multivitamin  Vision Formula  Vitamin C 500 mg tablet     GU PSH: Prostate Needle Biopsy - 11/14/2020, 2020       PSH Notes: Tonsilllectomy   multiple teeth removed   NON-GU PSH: Cataract surgery, Right Hernia Repair Surgical Pathology, Gross And Microscopic Examination For Prostate Needle - 11/14/2020, 2020     GU PMH: BPH w/o LUTS - 12/01/2020, - 04/19/2020, He does have some BPH by exam. He does not have any significant voiding symptoms., - 2020 Nocturia - 12/01/2020 Prostate Cancer, He has had grade group progression of his cancer with an increase in volume. I don't think he is a good candidate to continue AS. As he is 75 I have not recommended surgical therapy. I did discuss cryotherapy and HIFU but feel he would be best served with radiation therapy. He is interested in seeds and may be a candidate for monotherapy but I will defer to Dr. MTammi Klippelsince he has GG3 disease. I discussed the risks and side effects of EXRT and Seeds in detail as well as the risks of SpaceOAR. I explained that there may be a recommendation for a short course of hormone therapy if he is felt to be better served with EXRT but I will once again defer  to Dr. Tammi Klippel in that regard and will discuss that treatment at a later date if need be. Consultation request for Dr. Tammi Klippel placed. - 12/01/2020, - 11/14/2020, He has a left PIRADS 4 lesion and needs an MR Fusion biopsy. I have reviewed the risks of bleeding, infection and voiding difficulty. Levaquin sent. , - 10/13/2020, He is doing well with a stable PSA and benign exam. I will have him return in 31mowith a PSA and a prostate MRI to better determine the need for a surveillance biopsy. , - 04/19/2020 (Stable), His prostate remains benign with PSA at actually has gone down slightly to 4.66. I have recommended continued active surveillance with follow-up in 6 months., - 05/27/2019, After discussing the options at length he has elected  to proceed with active surveillance for his very low risk prostate cancer. I therefore will see him back again in 6 months for DRE and PSA., - 2020 Elevated PSA, 4.87 in 9/21 which was stable but he needs one today. - 10/13/2020, - 04/19/2020, He has an elevated PSA. After discussing the options he has elected to proceed with close monitoring with a repeat PSA in 3 months and then again 3 months following that with DRE at that time. He does understand there is some slight risk with this approach however I think this is reasonable. He does also understand that should his PSA continue on an upward trend we would then need to proceed with a prostate biopsy., - 2020      PMH Notes: Lyme disease diagnosed 2019   NON-GU PMH: Abnormal findings on diagnostic imaging of other parts of digestive tract, He had some rectal wall thickening on the MRI. He had a flex sig in 2020 by Dr. RBuford Dresserand I will let him know to look at the films and arrange f/u. - 10/13/2020 Glaucoma    FAMILY HISTORY: None    Notes: [place] renal mass [place]   SOCIAL HISTORY: Marital Status: Married Preferred Language: English; Ethnicity: Not Hispanic Or Latino; Race: White Current Smoking Status: Patient smokes.   Tobacco Use Assessment Completed: Used Tobacco in last 30 days? Does not use smokeless tobacco. Moderate Drinker.  Does not use drugs. Drinks 2 caffeinated drinks per day. Has not had a blood transfusion.     Notes: Tobacco 1/2-3/4 ppd x 60 years;  6-7 beers per week    REVIEW OF SYSTEMS:    GU Review Male:   Patient reports burning/ pain with urination and get up at night to urinate. Patient denies frequent urination, hard to postpone urination, leakage of urine, stream starts and stops, trouble starting your stream, have to strain to urinate , erection problems, and penile pain.  Gastrointestinal (Upper):   Patient denies nausea, vomiting, and indigestion/ heartburn.   Gastrointestinal (Lower):   Patient denies diarrhea and constipation.  Constitutional:   Patient denies fever, night sweats, weight loss, and fatigue.  Skin:   Patient denies skin rash/ lesion and itching.  Eyes:   Patient denies blurred vision and double vision.  Ears/ Nose/ Throat:   Patient denies sore throat and sinus problems.  Hematologic/Lymphatic:   Patient denies swollen glands and easy bruising.  Cardiovascular:   Patient denies leg swelling and chest pains.  Respiratory:   Patient denies cough and shortness of breath.  Endocrine:   Patient denies excessive thirst.  Musculoskeletal:   Patient denies back pain and joint pain.  Neurological:   Patient denies  headaches and dizziness.  Psychologic:   Patient denies depression and anxiety.   VITAL SIGNS:      03/06/2021 02:20 PM  Weight 160 lb / 72.57 kg  Height 70 in / 177.8 cm  BP 146/73 mmHg  Pulse 81 /min  Temperature 97.8 F / 36.5 C  BMI 23.0 kg/m   MULTI-SYSTEM PHYSICAL EXAMINATION:    Constitutional: Well-nourished. No physical deformities. Normally developed. Good grooming.  Neck: Neck symmetrical, not swollen. Normal tracheal position.  Respiratory: Normal breath sounds. No labored breathing, no use of accessory muscles.   Cardiovascular: Regular rate and rhythm. No murmur, no gallop.   Lymphatic: No enlargement of neck, axillae, groin.  Skin: No paleness, no jaundice, no cyanosis. No lesion, no ulcer, no rash.  Neurologic / Psychiatric: Oriented to time, oriented to place, oriented to person. No depression, no anxiety, no agitation.  Gastrointestinal: No mass, no tenderness, no rigidity, non obese abdomen.  Eyes: Normal conjunctivae. Normal eyelids.  Ears, Nose, Mouth, and Throat: Left ear no scars, no lesions, no masses. Right ear no scars, no lesions, no masses. Nose no scars, no lesions, no masses. Normal hearing. Normal lips.  Musculoskeletal: Normal gait and station of head and neck.     Complexity of Data:   Records Review:   Previous Patient Records  Urine Test Review:   Urinalysis   03/06/21  Urinalysis  Urine Appearance Clear   Urine Color Yellow   Urine Glucose Neg mg/dL  Urine Bilirubin Neg mg/dL  Urine Ketones Trace mg/dL  Urine Specific Gravity 1.020   Urine Blood Neg ery/uL  Urine pH 5.5   Urine Protein Neg mg/dL  Urine Urobilinogen 0.2 mg/dL  Urine Nitrites Neg   Urine Leukocyte Esterase Neg leu/uL   PROCEDURES:          Urinalysis - 81003 Dipstick Dipstick Cont'd  Color: Yellow Bilirubin: Neg mg/dL  Appearance: Clear Ketones: Trace mg/dL  Specific Gravity: 1.020 Blood: Neg ery/uL  pH: 5.5 Protein: Neg mg/dL  Glucose: Neg mg/dL Urobilinogen: 0.2 mg/dL    Nitrites: Neg    Leukocyte Esterase: Neg leu/uL    ASSESSMENT:      ICD-10 Details  1 GU:   Prostate Cancer - C61    PLAN:           Schedule Return Visit/Planned Activity: Keep Scheduled Appointment - Schedule Surgery          Document Letter(s):  Created for Patient: Clinical Summary         Notes:   There are no changes in the patients history or physical exam since last evaluation by Dr. Jeffie Pollock. Pt is scheduled to undergo seed and space oar placement on 03/23/21.   All pt's questions were answered to the best of my ability.          Next Appointment:      Next Appointment: 03/23/2021 12:30 PM    Appointment Type: Surgery     Location: [facility] [phone]    Provider: Irine Seal, M.D.    Reason for Visit: Dillon Walton

## 2021-03-23 NOTE — Anesthesia Postprocedure Evaluation (Signed)
Anesthesia Post Note  Patient: Dillon Party Sr.  Procedure(s) Performed: RADIOACTIVE SEED IMPLANT/BRACHYTHERAPY IMPLANT (Prostate) SPACE OAR INSTILLATION (Prostate) CYSTOSCOPY FLEXIBLE (Bladder)     Patient location during evaluation: PACU Anesthesia Type: General Level of consciousness: awake and alert Pain management: pain level controlled Vital Signs Assessment: post-procedure vital signs reviewed and stable Respiratory status: spontaneous breathing, nonlabored ventilation, respiratory function stable and patient connected to nasal cannula oxygen Cardiovascular status: blood pressure returned to baseline and stable Postop Assessment: no apparent nausea or vomiting Anesthetic complications: no   No notable events documented.  Last Vitals:  Vitals:   03/23/21 1430 03/23/21 1445  BP: 121/71 135/77  Pulse: 64 71  Resp: 13 16  Temp:    SpO2: 95% 94%    Last Pain:  Vitals:   03/23/21 1445  TempSrc:   PainSc: 6                  Laisha Rau P Steffanie Mingle

## 2021-03-23 NOTE — Interval H&P Note (Signed)
History and Physical Interval Note:  03/23/2021 12:10 PM  Dillon Megan Salon Sr.  has presented today for surgery, with the diagnosis of PROSTATE CANCER.  The various methods of treatment have been discussed with the patient and family. After consideration of risks, benefits and other options for treatment, the patient has consented to  Procedure(s): RADIOACTIVE SEED IMPLANT/BRACHYTHERAPY IMPLANT (N/A) SPACE OAR INSTILLATION (N/A) as a surgical intervention.  The patient's history has been reviewed, patient examined, no change in status, stable for surgery.  I have reviewed the patient's chart and labs.  Questions were answered to the patient's satisfaction.     Irine Seal

## 2021-03-23 NOTE — Transfer of Care (Signed)
Immediate Anesthesia Transfer of Care Note  Patient: [name]  Procedure(s) Performed: RADIOACTIVE SEED IMPLANT/BRACHYTHERAPY IMPLANT (Prostate) SPACE OAR INSTILLATION (Prostate) CYSTOSCOPY FLEXIBLE (Bladder)  Patient Location: PACU  Anesthesia Type:General  Level of Consciousness: awake, patient cooperative and responds to stimulation  Airway & Oxygen Therapy: Patient Spontanous Breathing  Post-op Assessment: Report given to RN and Post -op Vital signs reviewed and stable  Post vital signs: Reviewed and stable  Last Vitals:  Vitals Value Taken Time  BP 152/93 03/23/21 1408  Temp    Pulse 82 03/23/21 1410  Resp 11 03/23/21 1410  SpO2 94 % 03/23/21 1410  Vitals shown include unvalidated device data.  Last Pain:  Vitals:   03/23/21 1058  TempSrc: Oral  PainSc: 0-No pain      Patients Stated Pain Goal: 4 ([id number])  Complications: No notable events documented.

## 2021-03-26 ENCOUNTER — Encounter (HOSPITAL_BASED_OUTPATIENT_CLINIC_OR_DEPARTMENT_OTHER): Payer: Self-pay | Admitting: Urology

## 2021-03-26 NOTE — Addendum Note (Signed)
Addendum  created 03/26/21 [id number] by Bonney Aid, CRNA   Charge Capture section accepted

## 2021-04-05 ENCOUNTER — Telehealth: Payer: Self-pay | Admitting: *Deleted

## 2021-04-05 NOTE — Telephone Encounter (Signed)
CALLED PATIENT TO INFORM THAT HIS POST SEED APPTS. HAVE BEEN MOVED TO AM APPTS. ON 04-13-21, SPOKE WITH PATIENT AND HE IS AWARE OF THIS CHANGE AND IS GOOD WITH IT.

## 2021-04-09 ENCOUNTER — Telehealth: Payer: Self-pay | Admitting: *Deleted

## 2021-04-09 NOTE — Telephone Encounter (Signed)
RETURNED PATIENT'S WIFE'S PHONE CALL, SPOKE WITH PATIENT'S WIFE ?

## 2021-04-11 ENCOUNTER — Telehealth: Payer: Self-pay | Admitting: *Deleted

## 2021-04-11 NOTE — Telephone Encounter (Signed)
CALLED PATIENT TO REMIND OF POST SEED APPTS. FOR 04-13-21, LVM FOR A RETURN CALL

## 2021-04-12 NOTE — Progress Notes (Signed)
  Radiation Oncology         [phone]) [phone] ________________________________  Name: Dillon Walton Sr. MRN: [id number]  Date: 04/13/2021  DOB: [birth year]  COMPLEX SIMULATION NOTE  NARRATIVE:  The patient was brought to the [facility] today following prostate seed implantation approximately one month ago.  Identity was confirmed.  All relevant records and images related to the planned course of therapy were reviewed.  Then, the patient was set-up supine.  CT images were obtained.  The CT images were loaded into the planning software.  Then the prostate and rectum were contoured.  Treatment planning then occurred.  The implanted iodine 125 seeds were identified by the physics staff for projection of radiation distribution  I have requested : 3D Simulation  I have requested a DVH of the following structures: Prostate and rectum.    ________________________________  Sheral Apley Tammi Klippel, M.D.

## 2021-04-12 NOTE — Progress Notes (Signed)
Radiation Oncology         [phone]) [phone] ________________________________  Name: Dillon Party Sr. MRN: [id number]  Date: 04/13/2021  DOB: Aug 29, 1945  Post-Seed Follow-Up Visit Note  CC: Dillon Edelman, MD  Diagnosis:   75 y.o. 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    ICD-10-CM   1. Malignant neoplasm of prostate (HCC)  C61       Interval Since Last Radiation:  3 weeks 03/23/21:  Insertion of radioactive I-125 seeds into the prostate gland; 145 Gy, definitive therapy with placement of SpaceOAR gel.  Narrative:  The patient returns today for routine follow-up.  He is complaining of increased urinary frequency and urinary hesitation symptoms. He filled out a questionnaire regarding urinary function today providing and overall IPSS score of 29 characterizing his symptoms as severe with nocturia x4, weak flow of stream, hesitancy, intermittency, urgency and frequency with stinging burning at the start of his stream..  His pre-implant score was 8. He denies any abdominal pain or bowel symptoms.  He saw Dillon Walton on 03/28/2021 and was started on Flomax and AZO.  He has noticed some improvement since starting the Flomax but is not sure that the AZO is providing any relief.  He has a follow-up visit at the urology office at 11 AM today with Dillon Duffel, NP.  He has some mild residual fatigue but overall, is pleased with his progress to date.  ALLERGIES:  has No Known Allergies.  Meds: Current Outpatient Medications  Medication Sig Dispense Refill   Ascorbic Acid (VITAMIN C) 1000 MG tablet Take 1,000 mg by mouth in the morning.     cholecalciferol (VITAMIN D3) 25 MCG (1000 UNIT) tablet Take 1,000 Units by mouth in the morning. As needed     diphenhydramine-acetaminophen (TYLENOL PM) 25-500 MG TABS tablet Take 2 tablets by mouth at bedtime.     HYDROcodone-acetaminophen (NORCO) 5-325 MG tablet Take 1 tablet by  mouth every 6 (six) hours as needed for moderate pain. 6 tablet 0   Multiple Vitamin (MULTIVITAMIN WITH MINERALS) TABS tablet Take 1 tablet by mouth daily.     Multiple Vitamins-Minerals (PRESERVISION AREDS 2 PO) Take 1 tablet by mouth in the morning and at bedtime.     No current facility-administered medications for this visit.    Physical Findings: In general this is a well appearing Caucasian male in no acute distress. He's alert and oriented x4 and appropriate throughout the examination. Cardiopulmonary assessment is negative for acute distress and he exhibits normal effort.   Lab Findings: Lab Results  Component Value Date   WBC 9.8 03/20/2021   HGB 14.4 03/20/2021   HCT 43.4 03/20/2021   MCV 94.8 03/20/2021   PLT 303 03/20/2021    Radiographic Findings:  Patient underwent CT imaging in our clinic for post implant dosimetry. The CT will be reviewed by Dillon Walton to confirm there is an adequate distribution of radioactive seeds throughout the prostate gland and ensure that there are no seeds in or near the rectum.  We suspect the final radiation plan and dosimetry will show appropriate coverage of the prostate gland. He understands that we will call and inform him of any unexpected findings on further review of his imaging and dosimetry.  Impression/Plan: 75 y.o. 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 The patient is recovering from the effects of radiation.  His urinary symptoms should gradually improve over the next 4-6 months. We talked about this today. He is encouraged by his improvement already and is otherwise pleased with his outcome.  We are going to try increasing his Flomax to twice daily and I will send a prescription for Pyridium to use in place of the AZO since this does not seem to be providing much if any relief.  We also talked about long-term follow-up for prostate cancer following seed implant. He understands  that ongoing PSA determinations and digital rectal exams will help perform surveillance to rule out disease recurrence. He has a follow up appointment scheduled with Dillon Browns, NP later this morning and will see Dillon Walton in November 2022 for his first posttreatment PSA. He understands what to expect with his PSA measures. Patient was also educated today about some of the long-term effects from radiation including a small risk for rectal bleeding and possibly erectile dysfunction. We talked about some of the general management approaches to these potential complications. However, I did encourage the patient to contact our office or return at any point if he has questions or concerns related to his previous radiation and prostate cancer.    Dillon Johns, PA-C

## 2021-04-13 ENCOUNTER — Ambulatory Visit
Admission: RE | Admit: 2021-04-13 | Discharge: 2021-04-13 | Disposition: A | Discharge status: Home / Self Care | Payer: Medicare HMO | Source: Ambulatory Visit | Attending: Radiation Oncology | Admitting: Radiation Oncology

## 2021-04-13 ENCOUNTER — Other Ambulatory Visit: Payer: Self-pay

## 2021-04-13 ENCOUNTER — Ambulatory Visit: Payer: Medicare HMO | Admitting: Radiation Oncology

## 2021-04-13 ENCOUNTER — Encounter: Payer: Self-pay | Admitting: Urology

## 2021-04-13 VITALS — BP 134/74 | HR 72 | Temp 97.5°F | Resp 16 | Ht 70.0 in | Wt 158.8 lb

## 2021-04-13 DIAGNOSIS — C61 Malignant neoplasm of prostate: Secondary | ICD-10-CM

## 2021-04-13 MED ORDER — PHENAZOPYRIDINE HCL 200 MG PO TABS: 200.0000 mg | ORAL_TABLET | Freq: Three times a day (TID) | ORAL | 0 refills | Status: AC | PRN

## 2021-04-13 NOTE — Progress Notes (Signed)
Patient in for post seed appointment. Patient IPSS score is 29. Patient states he has severe dysuria that has not been relieved with AZO. Patient recently started on Flomax, one capsule at night. Patient reports incomplete bladder emptying, urinary urgency, and straining to start urine stream more than half the time. Reports intermittency, and  weak urine stream almost always. Reports urinary frequency about half the time. Patient has follow up with urologist on 04/13/21.

## 2021-05-07 ENCOUNTER — Ambulatory Visit
Admission: RE | Admit: 2021-05-07 | Discharge: 2021-05-07 | Disposition: A | Discharge status: Home / Self Care | Payer: Medicare HMO | Source: Ambulatory Visit | Attending: Radiation Oncology | Admitting: Radiation Oncology

## 2021-05-07 ENCOUNTER — Telehealth: Payer: Self-pay | Admitting: *Deleted

## 2021-05-07 ENCOUNTER — Encounter: Payer: Self-pay | Admitting: Radiation Oncology

## 2021-05-07 ENCOUNTER — Telehealth: Payer: Self-pay | Admitting: Adult Health

## 2021-05-07 DIAGNOSIS — C61 Malignant neoplasm of prostate: Secondary | ICD-10-CM | POA: Insufficient documentation

## 2021-05-07 NOTE — Telephone Encounter (Signed)
Called patient and provided education about SCP visit, and sent schedule message to get him set up for early November, 2022 [facility] phone visit.    Wilber Bihari, NP

## 2021-05-07 NOTE — Telephone Encounter (Signed)
Scheduled per 10/10 sch msg, msg was left on pts voicemail

## 2021-05-08 NOTE — Progress Notes (Signed)
  Radiation Oncology         [phone]) [phone] ________________________________  Name: [name] MRN: [id number]  Date: 05/07/2021  DOB: February 22, 1946  3D Planning Note   Prostate Brachytherapy Post-Implant Dosimetry  Diagnosis: 75 y.o. 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  Narrative: On a previous date, Sheldon Sem Sr. returned following prostate seed implantation for post implant planning. He underwent CT scan complex simulation to delineate the three-dimensional structures of the pelvis and demonstrate the radiation distribution.  Since that time, the seed localization, and complex isodose planning with dose volume histograms have now been completed.  Results:   Prostate Coverage - The dose of radiation delivered to the 90% or more of the prostate gland (D90) was 108.82% of the prescription dose. This exceeds our goal of greater than 90%. Rectal Sparing - The volume of rectal tissue receiving the prescription dose or higher was 0.08 cc. This falls under our thresholds tolerance of 1.0 cc.  Impression: The prostate seed implant appears to show adequate target coverage and appropriate rectal sparing.  Plan:  The patient will continue to follow with urology for ongoing PSA determinations. I would anticipate a high likelihood for local tumor control with minimal risk for rectal morbidity.  ________________________________  Sheral Apley Tammi Klippel, M.D.

## 2021-06-04 ENCOUNTER — Inpatient Hospital Stay: Payer: Medicare HMO | Admitting: *Deleted

## 2021-06-08 ENCOUNTER — Telehealth: Payer: Self-pay | Admitting: *Deleted

## 2021-06-25 ENCOUNTER — Inpatient Hospital Stay: Payer: Medicare HMO | Attending: Adult Health | Admitting: *Deleted

## 2021-06-25 ENCOUNTER — Encounter: Payer: Self-pay | Admitting: *Deleted

## 2021-06-25 ENCOUNTER — Other Ambulatory Visit: Payer: Self-pay

## 2021-06-25 DIAGNOSIS — C61 Malignant neoplasm of prostate: Secondary | ICD-10-CM

## 2021-06-25 NOTE — Progress Notes (Signed)
  2 Identifiers used during this visit for verification purposes. SCP reviewed and completed. SDOH assessed and completed. Pt  denies pain and fatigue. Pt does has some urgency  with urination during daytime and burning with urination at beginning but then he says it goes away. Pt feels like he is not emptying bladder completely. Urine flow is weak. Pt still experiencing some nocturia 2-4x a night but able to go back to sleep without any problem. He averages about 6 hours per night. Bowels seem to be hard during first BM of the day then it becomes looser to almost like diarrhea. He does not take any type of bowel medication. Pt is current with colonoscopy. Last colonoscopy in 2022. Pt is a smoker, has smoked for 20+ years,1/2 pack per day.Recommendation for lung cancer screening. Pt is not current on vaccines and is hesitant about taking any. Due for a visit with PCP in Feb,2023. Pt walks to post office daily for exercise. He says it's a 10 minute walk and does lots of yardwork. He will see Dr. Jeffie Pollock on July 11, 2021.

## 2022-10-25 IMAGING — DX DG CHEST 2V
2 series · 2 of 2 positions shown · non-contrast
Comparison: No priors.

CLINICAL DATA: 74-year-old male under preoperative evaluation prior
to prostate surgery.

EXAM:
CHEST - 2 VIEW

[chest pa]
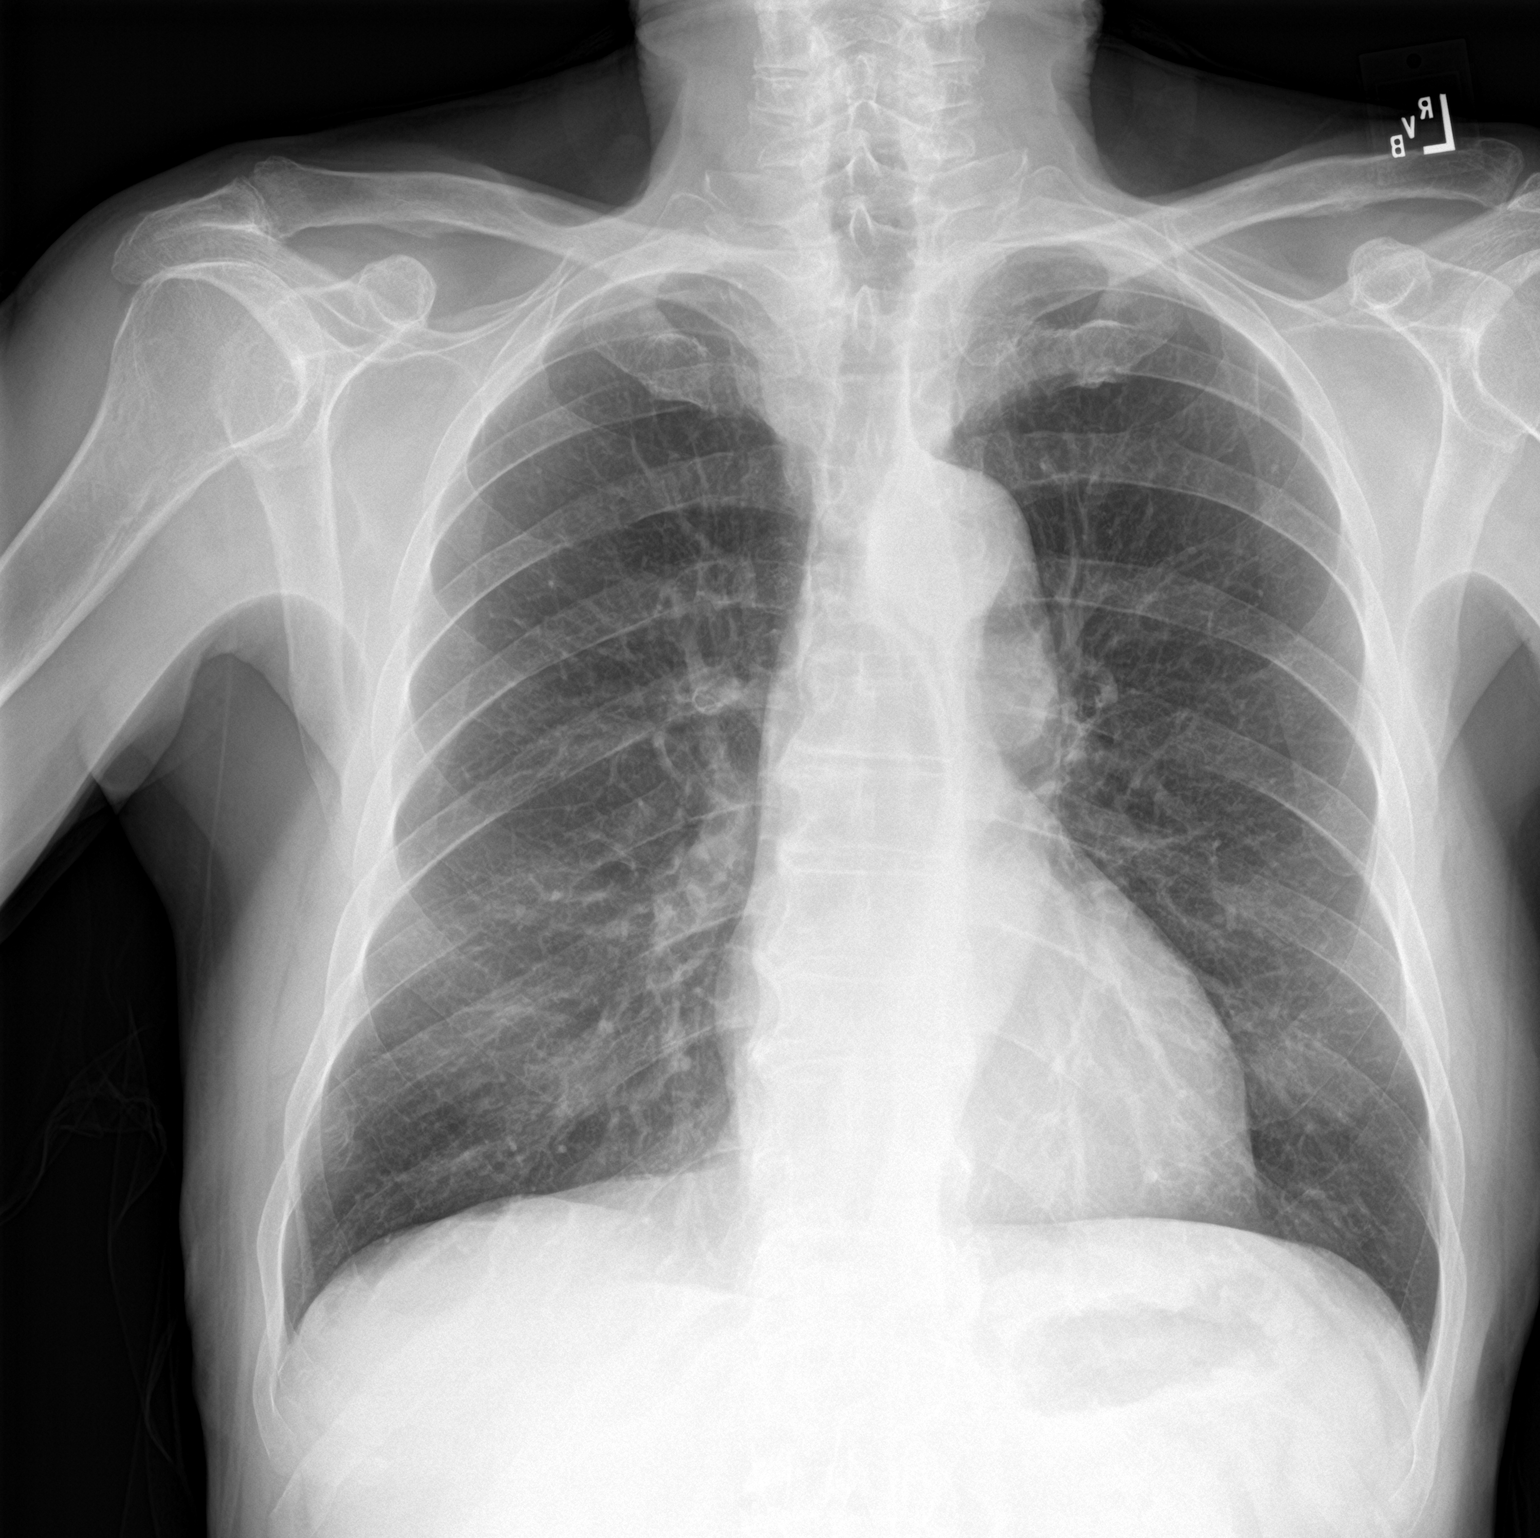

[chest lat]
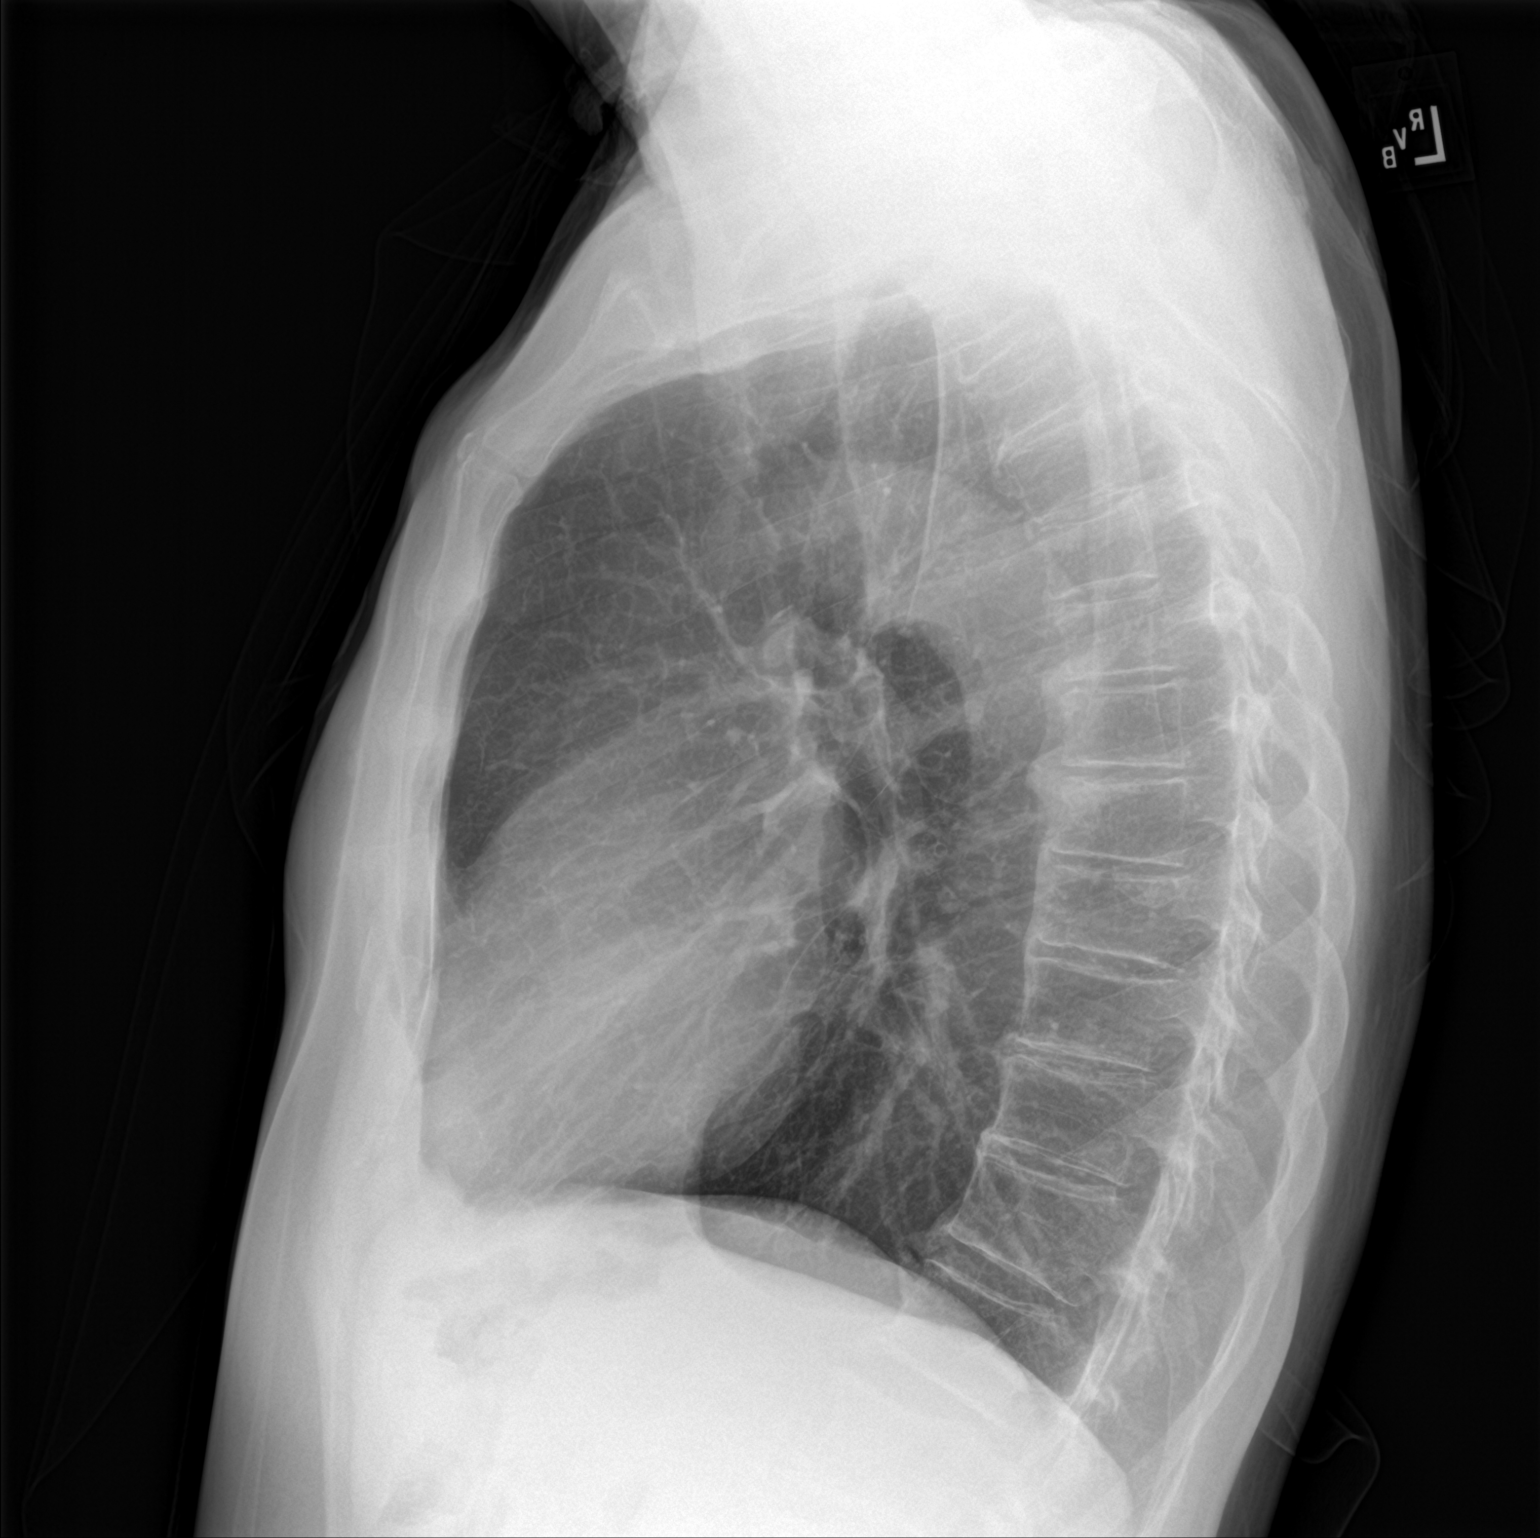

[2 of 2 positions shown; findings below may reference images not displayed]

FINDINGS: Lung volumes are normal. No consolidative airspace disease. No
pleural effusions. No pneumothorax. No pulmonary nodule or mass
noted. Pulmonary vasculature and the cardiomediastinal silhouette
are within normal limits.
IMPRESSION: No radiographic evidence of acute cardiopulmonary disease.
